# Patient Record
Sex: Female | Born: 1995 | Race: White | Hispanic: No | Marital: Single | Smoking: Current every day smoker
Health system: Southern US, Community
[De-identification: ages and names within clinical notes are randomized; demographics above are authoritative.]

## PROBLEM LIST (undated history)

## (undated) DIAGNOSIS — J45909 Unspecified asthma, uncomplicated: Secondary | ICD-10-CM

## (undated) HISTORY — PX: TONSILLECTOMY: SUR1361

---
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
## 2019-03-20 ENCOUNTER — Other Ambulatory Visit: Payer: Self-pay

## 2019-03-20 ENCOUNTER — Encounter (HOSPITAL_COMMUNITY): Payer: Self-pay

## 2019-03-20 ENCOUNTER — Emergency Department (HOSPITAL_COMMUNITY)
Admission: EM | Admit: 2019-03-20 | Discharge: 2019-03-21 | Disposition: A | Discharge status: Home / Self Care | Payer: 59 | Attending: Emergency Medicine | Admitting: Emergency Medicine

## 2019-03-20 ENCOUNTER — Emergency Department (HOSPITAL_COMMUNITY): Payer: 59

## 2019-03-20 DIAGNOSIS — R1031 Right lower quadrant pain: Secondary | ICD-10-CM | POA: Diagnosis not present

## 2019-03-20 DIAGNOSIS — R112 Nausea with vomiting, unspecified: Secondary | ICD-10-CM

## 2019-03-20 DIAGNOSIS — R7989 Other specified abnormal findings of blood chemistry: Secondary | ICD-10-CM | POA: Diagnosis not present

## 2019-03-20 DIAGNOSIS — J45909 Unspecified asthma, uncomplicated: Secondary | ICD-10-CM | POA: Insufficient documentation

## 2019-03-20 DIAGNOSIS — R197 Diarrhea, unspecified: Secondary | ICD-10-CM | POA: Diagnosis not present

## 2019-03-20 DIAGNOSIS — R1032 Left lower quadrant pain: Secondary | ICD-10-CM | POA: Diagnosis not present

## 2019-03-20 DIAGNOSIS — R109 Unspecified abdominal pain: Secondary | ICD-10-CM

## 2019-03-20 DIAGNOSIS — R1030 Lower abdominal pain, unspecified: Secondary | ICD-10-CM | POA: Diagnosis present

## 2019-03-20 DIAGNOSIS — F17210 Nicotine dependence, cigarettes, uncomplicated: Secondary | ICD-10-CM | POA: Diagnosis not present

## 2019-03-20 LAB — URINALYSIS, ROUTINE W REFLEX MICROSCOPIC
Bilirubin Urine: NEGATIVE
Glucose, UA: NEGATIVE mg/dL
Hgb urine dipstick: NEGATIVE
Ketones, ur: 20 mg/dL — AB
Nitrite: NEGATIVE
Protein, ur: NEGATIVE mg/dL
Specific Gravity, Urine: 1.008 (ref 1.005–1.030)
pH: 5 (ref 5.0–8.0)

## 2019-03-20 LAB — COMPREHENSIVE METABOLIC PANEL
ALT: 18 U/L (ref 0–44)
AST: 19 U/L (ref 15–41)
Albumin: 4.1 g/dL (ref 3.5–5.0)
Alkaline Phosphatase: 23 U/L — ABNORMAL LOW (ref 38–126)
Anion gap: 11 (ref 5–15)
BUN: 15 mg/dL (ref 6–20)
CO2: 22 mmol/L (ref 22–32)
Calcium: 8.3 mg/dL — ABNORMAL LOW (ref 8.9–10.3)
Chloride: 106 mmol/L (ref 98–111)
Creatinine, Ser: 1.41 mg/dL — ABNORMAL HIGH (ref 0.44–1.00)
GFR calc Af Amer: >60 mL/min (ref >60)
GFR calc non Af Amer: 53 mL/min — ABNORMAL LOW (ref >60)
Glucose, Bld: 99 mg/dL (ref 70–99)
Potassium: 3.4 mmol/L — ABNORMAL LOW (ref 3.5–5.1)
Sodium: 139 mmol/L (ref 135–145)
Total Bilirubin: 0.8 mg/dL (ref 0.3–1.2)
Total Protein: 6.5 g/dL (ref 6.5–8.1)

## 2019-03-20 LAB — CBC WITH DIFFERENTIAL/PLATELET
Abs Immature Granulocytes: 0.02 10*3/uL (ref 0.00–0.07)
Basophils Absolute: 0 10*3/uL (ref 0.0–0.1)
Basophils Relative: 0 %
Eosinophils Absolute: 0 10*3/uL (ref 0.0–0.5)
Eosinophils Relative: 0 %
HCT: 40.4 % (ref 36.0–46.0)
Hemoglobin: 13.4 g/dL (ref 12.0–15.0)
Immature Granulocytes: 0 %
Lymphocytes Relative: 9 %
Lymphs Abs: 1 10*3/uL (ref 0.7–4.0)
MCH: 32 pg (ref 26.0–34.0)
MCHC: 33.2 g/dL (ref 30.0–36.0)
MCV: 96.4 fL (ref 80.0–100.0)
Monocytes Absolute: 0.7 10*3/uL (ref 0.1–1.0)
Monocytes Relative: 6 %
Neutro Abs: 9.6 10*3/uL — ABNORMAL HIGH (ref 1.7–7.7)
Neutrophils Relative %: 85 %
Platelets: 303 10*3/uL (ref 150–400)
RBC: 4.19 MIL/uL (ref 3.87–5.11)
RDW: 11.9 % (ref 11.5–15.5)
WBC: 11.3 10*3/uL — ABNORMAL HIGH (ref 4.0–10.5)
nRBC: 0 % (ref 0.0–0.2)

## 2019-03-20 LAB — I-STAT BETA HCG BLOOD, ED (MC, WL, AP ONLY): I-stat hCG, quantitative: <5 m[IU]/mL (ref <5)

## 2019-03-20 LAB — LIPASE, BLOOD: Lipase: 20 U/L (ref 11–51)

## 2019-03-20 MED ORDER — IOHEXOL 300 MG/ML  SOLN
100.0000 mL | Freq: Once | INTRAMUSCULAR | Status: AC | PRN
  Administered 2019-03-20: 23:00:00 100 mL via INTRAVENOUS

## 2019-03-20 MED ORDER — SODIUM CHLORIDE 0.9 % IV BOLUS
1000.0000 mL | Freq: Once | INTRAVENOUS | Status: AC
  Administered 2019-03-20: 1000 mL via INTRAVENOUS

## 2019-03-20 MED ORDER — ONDANSETRON HCL 4 MG/2ML IJ SOLN
4.0000 mg | Freq: Once | INTRAMUSCULAR | Status: AC
  Administered 2019-03-20: 4 mg via INTRAVENOUS
  Filled 2019-03-20: qty 2

## 2019-03-20 MED ORDER — PROMETHAZINE HCL 25 MG/ML IJ SOLN
12.5000 mg | Freq: Once | INTRAMUSCULAR | Status: AC
  Administered 2019-03-20: 23:00:00 12.5 mg via INTRAVENOUS
  Filled 2019-03-20: qty 1

## 2019-03-20 MED ORDER — FENTANYL CITRATE (PF) 100 MCG/2ML IJ SOLN
25.0000 ug | Freq: Once | INTRAMUSCULAR | Status: AC
  Administered 2019-03-20: 22:00:00 25 ug via INTRAVENOUS
  Filled 2019-03-20: qty 2

## 2019-03-20 NOTE — ED Notes (Signed)
Pt transported to CT ?

## 2019-03-20 NOTE — ED Provider Notes (Signed)
[facility] EMERGENCY DEPARTMENT Provider Note   CSN: [id number] Arrival date & time: 03/20/19  2008     History   Chief Complaint Chief Complaint  Patient presents with   Abdominal Pain    HPI Madison Page is a 23 y.o. female.     HPI   Madison Page is a 23 y.o. female who presents to the Emergency Department complaining of worsening of lower abdominal pain that began 3 days ago.  She states the pain was sudden in onset and initially accompanied by diarrhea, nausea, and vomiting.  She states she no longer has diarrhea but continues to have multiple episodes of vomiting.  She was seen at an urgent care yesterday and given prescriptions for Cipro and Flagyl, but her symptoms are not improving.  She still reports having a normal menses with her last menstrual cycle beginning 4 days ago.  She has been tolerating fluids in very small amounts, but last meal was 2 days ago.  She denies fever, cough, dysuria, vaginal discharge and abnormal vaginal bleeding.  No previous abdominal surgeries.    Past Medical History:  Diagnosis Date   Asthma     There are no active problems to display for this patient.   Past Surgical History:  Procedure Laterality Date   TONSILLECTOMY       OB History   No obstetric history on file.      Home Medications    Prior to Admission medications   Not on File    Family History History reviewed. No pertinent family history.  Social History Social History   Tobacco Use   Smoking status: Current Every Day Smoker   Smokeless tobacco: Never Used  Substance Use Topics   Alcohol use: No   Drug use: Never     Allergies   Patient has no allergy information on record.   Review of Systems Review of Systems  Constitutional: Positive for appetite change. Negative for chills and fever.  HENT: Negative for sore throat.   Respiratory: Negative for cough, chest tightness and shortness of breath.   Cardiovascular: Negative for  chest pain.  Gastrointestinal: Positive for abdominal pain, diarrhea, nausea and vomiting. Negative for blood in stool.  Genitourinary: Negative for decreased urine volume, difficulty urinating, dysuria, flank pain, menstrual problem and vaginal discharge.  Musculoskeletal: Negative for back pain.  Skin: Negative for color change and rash.  Neurological: Negative for dizziness, weakness and numbness.  Hematological: Negative for adenopathy.     Physical Exam Updated Vital Signs BP (!) 111/59 (BP Location: Right Arm)    Pulse 60    Temp 98 F (36.7 C) (Oral)    Resp 17    Ht 5\' 9"  (1.753 m)    Wt 63.5 kg    LMP 03/16/2019    SpO2 100%    BMI 20.67 kg/m   Physical Exam Vitals signs and nursing note reviewed.  Constitutional:      General: She is not in acute distress.    Appearance: She is well-developed.     Comments: Patient is uncomfortable appearing but nontoxic  HENT:     Mouth/Throat:     Mouth: Mucous membranes are moist.  Cardiovascular:     Rate and Rhythm: Normal rate and regular rhythm.     Pulses: Normal pulses.  Pulmonary:     Effort: Pulmonary effort is normal.  Abdominal:     General: Bowel sounds are normal. There is no distension.     Palpations: Abdomen is  soft. There is no mass.     Tenderness: There is abdominal tenderness in the right lower quadrant and left lower quadrant. There is no right CVA tenderness or left CVA tenderness.     Comments: Diffuse tenderness of the lower abdomen with worsening pain of the right lower quadrant.  No guarding or rebound tenderness.   Musculoskeletal: Normal range of motion.  Skin:    General: Skin is warm.  Neurological:     General: No focal deficit present.     Mental Status: She is alert.      ED Treatments / Results  Labs (all labs ordered are listed, but only abnormal results are displayed) Labs Reviewed  CBC WITH DIFFERENTIAL/PLATELET - Abnormal; Notable for the following components:      Result Value   WBC  11.3 (*)    Neutro Abs 9.6 (*)    All other components within normal limits  URINALYSIS, ROUTINE W REFLEX MICROSCOPIC - Abnormal; Notable for the following components:   Color, Urine STRAW (*)    Ketones, ur 20 (*)    Leukocytes,Ua TRACE (*)    Bacteria, UA RARE (*)    All other components within normal limits  COMPREHENSIVE METABOLIC PANEL - Abnormal; Notable for the following components:   Potassium 3.4 (*)    Creatinine, Ser 1.41 (*)    Calcium 8.3 (*)    Alkaline Phosphatase 23 (*)    GFR calc non Af Amer 53 (*)    All other components within normal limits  LIPASE, BLOOD  I-STAT BETA HCG BLOOD, ED ([facility], [facility], [facility] ONLY)    EKG None  Radiology Ct Abdomen Pelvis W Contrast  Result Date: 03/20/2019 CLINICAL DATA:  Nausea vomiting abdominal pain EXAM: CT ABDOMEN AND PELVIS WITH CONTRAST TECHNIQUE: Multidetector CT imaging of the abdomen and pelvis was performed using the standard protocol following bolus administration of intravenous contrast. CONTRAST:  100mL OMNIPAQUE IOHEXOL 300 MG/ML  SOLN COMPARISON:  None. FINDINGS: Lower chest: Lung bases demonstrate pectus deformity of the lower anterior chest wall. The heart size is nonenlarged. The lung bases are clear Hepatobiliary: Heterogeneous liver enhancement. Hypodensity around the portal vessels, suspect for periportal edema. No calcified gallstone or biliary dilatation. Pancreas: Unremarkable. No pancreatic ductal dilatation or surrounding inflammatory changes. Spleen: Normal in size without focal abnormality. Adrenals/Urinary Tract: Adrenal glands are unremarkable. Kidneys are normal, without renal calculi, focal lesion, or hydronephrosis. Bladder is unremarkable. Stomach/Bowel: Slight wall thickening of the gastric antrum. Appendix appears normal. No evidence of bowel wall thickening, distention, or inflammatory changes. Liquid stools in the right colon. Vascular/Lymphatic: No significant vascular findings are present. No enlarged abdominal  or pelvic lymph nodes. Reproductive: Uterus and bilateral adnexa are unremarkable. Other: Trace free fluid in the pelvis.  No free air. Musculoskeletal: No acute or significant osseous findings. IMPRESSION: 1. Negative for appendicitis or bowel obstruction 2. Slight wall thickening of the gastric antrum is questionable for gastritis 3. Liver is slightly heterogenous in appearance and there is suggestion of mild periportal edema, which is nonspecific, though could correlate with LFTs to exclude hepatitis. 4. Trace free fluid in the pelvis Electronically Signed   By: Jasmine PangKim  Fujinaga M.D.   On: 03/20/2019 22:48    Procedures Procedures (including critical care time)  Medications Ordered in ED Medications  sodium chloride 0.9 % bolus 1,000 mL (0 mLs Intravenous Stopped 03/20/19 2327)  ondansetron (ZOFRAN) injection 4 mg (4 mg Intravenous Given 03/20/19 2142)  fentaNYL (SUBLIMAZE) injection 25 mcg (25 mcg Intravenous  Given 03/20/19 2142)  iohexol (OMNIPAQUE) 300 MG/ML solution 100 mL (100 mLs Intravenous Contrast Given 03/20/19 2234)  promethazine (PHENERGAN) injection 12.5 mg (12.5 mg Intravenous Given 03/20/19 2259)  sodium chloride 0.9 % bolus 1,000 mL (1,000 mLs Intravenous New Bag/Given 03/20/19 2359)     Initial Impression / Assessment and Plan / ED Course  I have reviewed the triage vital signs and the nursing notes.  Pertinent labs & imaging results that were available during my care of the patient were reviewed by me and considered in my medical decision making (see chart for details).    Initial blood collection for lipase and CMP hemolyzed, so blood was redrawn.   CT this evening is negative for acute appendicitis.  Possible gastritis.  Lab work reassuring, but patient's creatinine is elevated she also has ketones in her urine this is felt to be from dehydration secondary to vomiting and diarrhea.  She was given IV fluids here and antiemetic.  She is feeling better.  She has ambulated to the  restroom without difficulty.  She is tolerating oral fluids.  I feel that she is appropriate for discharge home.  She was prescribed Cipro and Flagyl recently, I have advised her to discontinue these.  I have also advised her of her elevated creatinine and that she will need to have this rechecked in 1 week.  She agrees to plan.  She appears appropriate for discharge home, and strict return precautions were also discussed.   Final Clinical Impressions(s) / ED Diagnoses   Final diagnoses:  Abdominal pain, unspecified abdominal location  Nausea vomiting and diarrhea  Elevated serum creatinine    ED Discharge Orders    None       Kem Parkinson, PA-C 03/21/19 0053    Daleen Bo, MD 03/21/19 1004

## 2019-03-20 NOTE — ED Triage Notes (Signed)
Pt c/o abdominal pain that started on Thursday. Went to urgent care and received abx. siad she wasn't getting any better so she came here. Nausea and vomiting. No diarrhea since thursday.  Also has been having poor appetite.

## 2019-03-21 MED ORDER — PROMETHAZINE HCL 25 MG PO TABS: 25.0000 mg | ORAL_TABLET | Freq: Four times a day (QID) | ORAL | 0 refills | Status: DC | PRN

## 2019-03-21 MED ORDER — METOCLOPRAMIDE HCL 10 MG PO TABS
10.0000 mg | ORAL_TABLET | Freq: Once | ORAL | Status: AC
  Administered 2019-03-21: 10 mg via ORAL
  Filled 2019-03-21: qty 1

## 2019-03-21 NOTE — ED Notes (Signed)
PA at bedside.

## 2019-03-21 NOTE — ED Notes (Signed)
Pt ambulatory to restroom with no assistance needed at this time. 

## 2019-03-21 NOTE — Discharge Instructions (Signed)
Your CT this evening did not show evidence of appendicitis.  Stop taking the Cipro and Flagyl.  Drink frequent small sips of clear fluids and Gatorade tonight and tomorrow.  You may start bland diet as tolerated tomorrow.  Your creatinine this evening was elevated.  This will need to be rechecked in 5 to 7 days.  You may contact the clinic listed to establish primary care or a local urgent care facility may be able to check this for you.  Return to the ER for any worsening symptoms such as fever, persistent vomiting or worsening abdominal pain.

## 2019-03-21 NOTE — ED Provider Notes (Signed)
Patient discharged by previous shift.  Presented with lower abdominal pain for the past 3 days with nausea, vomiting and diarrhea.  She had a work-up that included CT scan that was negative for appendicitis but showed possible gastritis.  She was still having pain and nausea, and I was asked to see patient by nursing staff prior to discharge due to ongoing symptoms.  Before I evaluated the patient she decided that she felt better and she wanted to leave and go home. I spoke to the patient in the hallway and she said that she did not want to be seen and just wanted to try to go home. She agreed to return if she felt worse.   I did not examine patient.    Ezequiel Essex, MD 03/21/19 [phone]

## 2019-03-24 NOTE — Progress Notes (Signed)
Referring Provider: Leisa Lenz, MD Primary Care Physician:  Patient, [name] Primary Gastroenterologist:  Dr. Gala Romney  Chief Complaint  Patient presents with  . Abdominal Pain    improved  . Nausea  . Blood In Stools    1 week ago.    HPI:   Madison Page is a 23 y.o. female presenting today at the request of Leisa Lenz, MD for abdominal pain, vomiting, heme positive stools.   Patient was seen in the ED on 03/20/19 for acute onset of lower abdominal pain, diarrhea, nausea, and vomiting x3 days.  She had been seen by urgent care on 03/19/2019 and was prescribed Cipro and Flagyl.  Her diarrhea resolved but she continued with abdominal pain and multiple episodes of vomiting.  Labs in the ED with slightly elevated WBC at 11.3 neutrophils slightly elevated at 9.6, otherwise normal.  Lipase was within normal limits.  CMP was remarkable for elevated creatinine at 1.41, slightly low potassium 3.4, slightly low calcium 8.3.  Urinalysis with ketones.  Pregnancy test negative.  CT abdomen and pelvis with contrast negative for appendicitis or bowel obstruction.  Slight wall thickening of the gastric antrum questionable for gastritis.  No bowel wall thickening.  Liver with slightly heterogenous in appearance with suggestion of mild periportal edema, which is nonspecific..  Trace free fluid in the pelvis.  Patient received fluids, antiemetics, Reglan, pain medication and began to feel improved.  She was discharged with Phenergan.  Today she states early Friday morning she developed severe diarrhea, lower abdominal pain, nausea with vomiting. Went to ED on Sunday because abdominal pain and vomiting was unbearable. Her diarrhea resolved on Friday after several bowel movements.  Reports she had 1 BM that had the color of a "red bell pepper" in part of the stool.  Denies eating anything red the day before.  Had popcorn and a fiber 1 bar. No bowel movement since Friday.  She is passing gas.   Cramping/sharp abdominal pain along with nausea/vomiting would come and go in waves.  Abdominal pain has been slowly improving and today is the first day without pain.  No identified triggers. Has been using Phenergan for nausea which helps some but ran out of oral Phenergan on Wednesday.  Has had persistent nausea for the last 12 hours.  Tried Phenergan suppository last night but felt symptoms worsened thereafter.  Has not vomited since Thursday.  Denies hematemesis or coffee-ground emesis. She did have headache/migraine last night that worsened nausea. Mints help nausea. No upper abdominal pain.   Prior to this acute episode, BMs usually soft and formed, daily to every couple of days. No rectal pain. No hemorrhoids.   No GERD symptoms. No dysphagia. No urinary symptoms. No fever. Admitted to chills. Some dizziness with pain and nausea. No passing out. Has been going to the [place]. Has intentionally lost about 15 lbs since May 2020. Thinks she has lost 5 lbs over the last week due to this acute illness.   No sick contacts. Had not eaten out recently.  Had not eaten anything out of the norm. No antibiotics or hospitalizations.   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   Ibuprofen a couple days a month. Usually first and second day of period. Will take 400 mg 4 times a day during this time.   Past Medical History:  Diagnosis Date  . Asthma     Past Surgical History:  Procedure Laterality Date  . TONSILLECTOMY  Current Outpatient Medications  Medication Sig Dispense Refill  . norgestimate-ethinyl estradiol (MONO-LINYAH) 0.25-35 MG-MCG tablet Take 1 tablet by mouth daily.    . promethazine (PHENERGAN) 25 MG suppository Place 25 mg rectally every 6 (six) hours as needed for nausea or vomiting.    . ondansetron (ZOFRAN) 4 MG tablet Take 1 tablet (4 mg total) by mouth every 8 (eight) hours as needed for nausea or vomiting. 30 tablet 1  . pantoprazole (PROTONIX) 40 MG tablet Take 1  tablet (40 mg total) by mouth daily. 30 tablet 3   No current facility-administered medications for this visit.     Allergies as of 03/25/2019  . (No Known Allergies)    Family History  Problem Relation Age of Onset  . Colon cancer Neg Hx   . Inflammatory bowel disease Neg Hx     Social History   Socioeconomic History  . Marital status: Single    Spouse name: Not on file  . Number of children: Not on file  . Years of education: Not on file  . Highest education level: Not on file  Occupational History  . Not on file  Social Needs  . Financial resource strain: Not on file  . Food insecurity    Worry: Not on file    Inability: Not on file  . Transportation needs    Medical: Not on file    Non-medical: Not on file  Tobacco Use  . Smoking status: Current Every Day Smoker  . Smokeless tobacco: Never Used  . Tobacco comment: vapes  Substance and Sexual Activity  . Alcohol use: No  . Drug use: Yes    Types: Marijuana    Comment: 3-4 times a month  . Sexual activity: Yes    Birth control/protection: Pill  Lifestyle  . Physical activity    Days per week: Not on file    Minutes per session: Not on file  . Stress: Not on file  Relationships  . Social Musician on phone: Not on file    Gets together: Not on file    Attends religious service: Not on file    Active member of club or organization: Not on file    Attends meetings of clubs or organizations: Not on file    Relationship status: Not on file  . Intimate partner violence    Fear of current or ex partner: Not on file    Emotionally abused: Not on file    Physically abused: Not on file    Forced sexual activity: Not on file  Other Topics Concern  . Not on file  Social History Narrative  . Not on file    Review of Systems: Gen: See HPI CV: Denies chest pain, heart palpitations  Resp: Denies shortness of breath or cough  GI: See HPI GU : Denies urinary burning, urinary frequency, urinary  hesitancy MS: Denies joint pain, muscle weakness Derm: Denies rash Psych: Denies depression, anxiety Heme: See HPI.  Physical Exam: BP 118/76   Pulse 76   Temp (!) 97 F (36.1 C) (Oral)   Ht 5\' 9"  (1.753 m)   Wt 141 lb (64 kg)   LMP 03/16/2019   BMI 20.82 kg/m  General:   Alert and oriented. Pleasant and cooperative. Well-nourished and well-developed.  Head:  Normocephalic and atraumatic. Eyes:  Without icterus, sclera clear and conjunctiva pink.  Ears:  Normal auditory acuity. Nose:  No deformity, discharge,  or lesions. Lungs:  Clear to auscultation  bilaterally. No wheezes, rales, or rhonchi. No distress.  Heart:  S1, S2 present without murmurs appreciated.  Abdomen:  +BS, soft, and non-distended.  Mild tenderness to palpation in the epigastric area and lower abdomen. No HSM noted. No guarding or rebound. No masses appreciated.  Rectal:  Deferred  Msk:  Symmetrical without gross deformities. Normal posture. Extremities:  Without edema. Neurologic:  Alert and  oriented x4;  grossly normal neurologically. Skin:  Intact without significant lesions or rashes. Psych: Normal mood and affect.

## 2019-03-25 ENCOUNTER — Ambulatory Visit (INDEPENDENT_AMBULATORY_CARE_PROVIDER_SITE_OTHER): Payer: 59 | Admitting: Gastroenterology

## 2019-03-25 ENCOUNTER — Encounter: Payer: Self-pay | Admitting: Gastroenterology

## 2019-03-25 ENCOUNTER — Encounter: Payer: Self-pay | Admitting: *Deleted

## 2019-03-25 ENCOUNTER — Ambulatory Visit: Payer: 59 | Admitting: Gastroenterology

## 2019-03-25 ENCOUNTER — Other Ambulatory Visit: Payer: Self-pay

## 2019-03-25 ENCOUNTER — Other Ambulatory Visit: Payer: Self-pay | Admitting: *Deleted

## 2019-03-25 ENCOUNTER — Ambulatory Visit (HOSPITAL_COMMUNITY)
Admission: RE | Admit: 2019-03-25 | Discharge: 2019-03-25 | Disposition: A | Discharge status: Home / Self Care | Payer: No Typology Code available for payment source | Source: Ambulatory Visit | Attending: Gastroenterology | Admitting: Gastroenterology

## 2019-03-25 VITALS — BP 118/76 | HR 76 | Temp 97.0°F | Ht 69.0 in | Wt 141.0 lb

## 2019-03-25 DIAGNOSIS — R1110 Vomiting, unspecified: Secondary | ICD-10-CM | POA: Insufficient documentation

## 2019-03-25 DIAGNOSIS — K3189 Other diseases of stomach and duodenum: Secondary | ICD-10-CM | POA: Diagnosis not present

## 2019-03-25 DIAGNOSIS — R1030 Lower abdominal pain, unspecified: Secondary | ICD-10-CM | POA: Insufficient documentation

## 2019-03-25 DIAGNOSIS — K5900 Constipation, unspecified: Secondary | ICD-10-CM | POA: Insufficient documentation

## 2019-03-25 DIAGNOSIS — R112 Nausea with vomiting, unspecified: Secondary | ICD-10-CM

## 2019-03-25 MED ORDER — ONDANSETRON HCL 4 MG PO TABS: 4.0000 mg | ORAL_TABLET | Freq: Three times a day (TID) | ORAL | 1 refills | Status: DC | PRN

## 2019-03-25 MED ORDER — PANTOPRAZOLE SODIUM 40 MG PO TBEC: 40.0000 mg | DELAYED_RELEASE_TABLET | Freq: Every day | ORAL | 3 refills | Status: DC

## 2019-03-25 NOTE — Assessment & Plan Note (Signed)
Addressed under lower abdominal pain.  

## 2019-03-25 NOTE — Assessment & Plan Note (Addendum)
Nausea and vomiting started on 03/18/2019 along with acute onset of lower abdominal pain and diarrhea.  Suspect patient likely had acute infectious gastroenteritis as symptoms seem to be improving.  However patient does continue with significant nausea.  CT abdomen and pelvis with contrast on 03/20/2019 with slight wall thickening of the gastric antrum questionable for gastritis.  Patient does admit to using 400 mg ibuprofen 4 times daily a couple times a month for menstrual cramps.  Denies hematemesis, coffee-ground emesis, GERD symptoms, upper abdominal pain, or melena.  Mild tenderness to palpation of the epigastric area on exam.  Although I suspect nausea and vomiting were likely related to acute illness, nausea remains significant and I feel patient needs further evaluation of the gastric wall thickening noted on CT.  Possible NSAID induced gastritis. ? H. Pylori. Less likely PUD.   Zofran 4 mg every 8 hours as needed. Protonix 40 mg daily 30 minutes before breakfast. EGD with propofol with Dr. Gala Romney in the near future. The risks, benefits, and alternatives have been discussed in detail with patient. They have stated understanding and desire to proceed. Follow-up after EGD. Patient advised to call if symptoms persist or worsen prior to her next visit.

## 2019-03-25 NOTE — Patient Instructions (Addendum)
Please have labs and abdominal x-ray completed.  We will call you with results.  Please start Protonix 40 mg daily 30 minutes before breakfast.  Avoid all NSAIDs including ibuprofen, Aleve, Advil, and Goody powders.  It is okay to use Tylenol as needed for pain.  Continue to follow bland diet.   I am sending in Zofran for you to use every 8 hours as needed for nausea.  We will get you scheduled for an upper endoscopy in the near future with Dr. Gala Romney.  Follow-up after upper endoscopy.  Please call if you have questions or concerns prior to your next visit or if symptoms worsen.  Aliene Altes, PA-C [facility]

## 2019-03-25 NOTE — Assessment & Plan Note (Addendum)
CT abdomen and pelvis on 03/20/2019 after patient presented to the ED for lower abdominal pain, diarrhea, nausea, and vomiting revealed slight wall thickening of the gastric antrum questionable for gastritis.  Diarrhea has resolved and abdominal pain slowly improved but patient continues with significant nausea.  Admits to 400 mg ibuprofen every 4 hours a couple times a month for menstrual cramps.  Denies hematemesis, coffee-ground emesis, GERD symptoms, subjective upper abdominal pain, or melena.   Although I suspect nausea and vomiting were likely related to acute illness, patient may also have NSAID induced gastritis. Although I doubt patient has other underlying organic cause of wall thickening noted on CT, I feel she needs direct visualization with EGD to verify this.   Protonix 40 mg daily. Avoid NSAIDs. Proceed with upper endoscopy in the near future with Dr. Gala Romney. The risks, benefits, and alternatives have been discussed in detail with patient. They have stated understanding and desire to proceed.

## 2019-03-25 NOTE — Assessment & Plan Note (Addendum)
23 year old female with past medical history of exercise-induced asthma presenting after acute onset of lower abdominal pain, diarrhea, nausea, and vomiting that started 7 days ago (03/18/2019). No sick contacts, had not eaten out or anything out of her normal, no undercooked meats.  Diarrhea resolved the day it started after several BMs.  No bowel movement since. Reported one episode of questionable blood in the stool.  Lower abdominal pain is improving with little to no pain today. She continues with waves of nausea. Has not vomited since yesterday, but now with persistent nausea for the last 12 hours.  Denies upper abdominal pain, GERD symptoms, dysphasia, hematemesis, coffee-ground emesis, or melena.  She has intentionally been losing weight but has lost a few additional pounds over the last week due to this acute illness.  CT abdomen and pelvis on 03/20/2019 with slight wall thickening of the gastric antrum questionable for gastritis.  No bowel wall thickening.  Liver slightly heterogenous with suggestion of mild periportal edema which is nonspecific.  LFTs were normal.  CBC with WBC 11.3 otherwise normal.  Lipase normal.  Creatinine elevated 1.41, potassium slightly low at 3.4.  Admits to ibuprofen a couple days a month for menstrual cramping.  Takes 400 mg every 4 hours on these days.  Exam with mild tenderness to palpation in the epigastric area and lower abdomen.  Patient may have had an acute infectious gastroenteritis that is resolving. No clear etiology. Encouraging that symptoms seem to be improving other than the nausea. Query whether nausea may be related to underlying gastritis with history of NSAID use. Doubt IBD as diarrhea was acute onset and resolved within 24 hours and CT with normal bowel. Single episode of ? Blood in the stool non-specific. Will need to continue to monitor this. May have obstipation due to significant diarrhea on Friday with decreased oral intake. Doubt obstruction.   Start  Protonix 40 mg daily.  Zofran 4 mg prn q8 hours  Abdominal x-ray to evaluate obstipation. Will likely start MiraLAX if no acute findings. CBC and BMP to follow WBC trend, electrolytes, and kidney function.  Avoid NSAIDs EGD with propofol with Dr. Gala Romney for evaluation of gastric wall thickening noted on CT. Risks, benefits, and alternatives have been discussed in detail with patient. They have stated understanding and desire to proceed.  Follow-up after EGD. Patient advised to call if Protonix and Zofran were not helping or if she had questions or concerns prior to her next visit. Also advised to call if she has return of brbpr.

## 2019-03-27 NOTE — Progress Notes (Signed)
Reviewed with Dr. Dario Guardian. No evidence of obstruction. There is stool throughout the colon. There are radiopacities within the bowel lumen that are likely something she ate. He could not commend on this specifically.   Madison Page, can you let patient know results? I tried calling her Friday and could not get an answer. Please ask if she has had a BM since I saw her on Friday.

## 2019-03-27 NOTE — H&P (View-Only) (Signed)
Reviewed with Dr. Litton. No evidence of obstruction. There is stool throughout the colon. There are radiopacities within the bowel lumen that are likely something she ate. He could not commend on this specifically.   Alicia, can you let patient know results? I tried calling her Friday and could not get an answer. Please ask if she has had a BM since I saw her on Friday.

## 2019-03-28 ENCOUNTER — Encounter: Payer: Self-pay | Admitting: *Deleted

## 2019-03-29 ENCOUNTER — Encounter (HOSPITAL_COMMUNITY): Payer: Self-pay

## 2019-03-29 ENCOUNTER — Telehealth: Payer: Self-pay | Admitting: *Deleted

## 2019-03-29 ENCOUNTER — Telehealth: Payer: Self-pay

## 2019-03-29 ENCOUNTER — Other Ambulatory Visit (HOSPITAL_COMMUNITY)
Admission: RE | Admit: 2019-03-29 | Discharge: 2019-03-29 | Disposition: A | Discharge status: Home / Self Care | Payer: No Typology Code available for payment source | Source: Ambulatory Visit | Attending: Internal Medicine | Admitting: Internal Medicine

## 2019-03-29 ENCOUNTER — Encounter (HOSPITAL_COMMUNITY)
Admission: RE | Admit: 2019-03-29 | Discharge: 2019-03-29 | Disposition: A | Discharge status: Home / Self Care | Payer: No Typology Code available for payment source | Source: Ambulatory Visit | Attending: Internal Medicine | Admitting: Internal Medicine

## 2019-03-29 ENCOUNTER — Other Ambulatory Visit: Payer: Self-pay

## 2019-03-29 DIAGNOSIS — Z20828 Contact with and (suspected) exposure to other viral communicable diseases: Secondary | ICD-10-CM | POA: Diagnosis not present

## 2019-03-29 DIAGNOSIS — Z01812 Encounter for preprocedural laboratory examination: Secondary | ICD-10-CM | POA: Insufficient documentation

## 2019-03-29 LAB — SARS CORONAVIRUS 2 (TAT 6-24 HRS): SARS Coronavirus 2: NEGATIVE

## 2019-03-29 LAB — PREGNANCY, URINE: Preg Test, Ur: NEGATIVE

## 2019-03-29 NOTE — Telephone Encounter (Signed)
Called and rescheduled pt.

## 2019-03-29 NOTE — Patient Instructions (Signed)
Madison Page  03/29/2019     @PREFPERIOPPHARMACY @   Your procedure is scheduled on  03/31/2019   Report to 04/02/2019 at  [facility]  A.M.  Call this number if you have problems the morning of surgery:  [phone]   Remember:  Do not eat or drink after midnight.                        Take these medicines the morning of surgery with A SIP OF WATER  zofran or phenergan and protonix.    Do not wear jewelry, make-up or nail polish.  Do not wear lotions, powders, or perfumes. Please wear deodorant and brush your teeth.  Do not shave 48 hours prior to surgery.  Men may shave face and neck.  Do not bring valuables to the hospital.  [facility] is not responsible for any belongings or valuables.  Contacts, dentures or bridgework may not be worn into surgery.  Leave your suitcase in the car.  After surgery it may be brought to your room.  For patients admitted to the hospital, discharge time will be determined by your treatment team.  Patients discharged the day of surgery will not be allowed to drive home.   Name and phone number of your driver:   family Special instructions:  None  Please read over the following fact sheets that you were given. Anesthesia Post-op Instructions and Care and Recovery After Surgery       Upper Endoscopy, Adult, Care After This sheet gives you information about how to care for yourself after your procedure. Your health care provider may also give you more specific instructions. If you have problems or questions, contact your health care provider. What can I expect after the procedure? After the procedure, it is common to have:  A sore throat.  Mild stomach pain or discomfort.  Bloating.  Nausea. Follow these instructions at home:   Follow instructions from your health care provider about what to eat or drink after your procedure.  Return to your normal activities as told by your health care provider. Ask your health care  provider what activities are safe for you.  Take over-the-counter and prescription medicines only as told by your health care provider.  Do not drive for 24 hours if you were given a sedative during your procedure.  Keep all follow-up visits as told by your health care provider. This is important. Contact a health care provider if you have:  A sore throat that lasts longer than one day.  Trouble swallowing. Get help right away if:  You vomit blood or your vomit looks like coffee grounds.  You have: ? A fever. ? Bloody, black, or tarry stools. ? A severe sore throat or you cannot swallow. ? Difficulty breathing. ? Severe pain in your chest or abdomen. Summary  After the procedure, it is common to have a sore throat, mild stomach discomfort, bloating, and nausea.  Do not drive for 24 hours if you were given a sedative during the procedure.  Follow instructions from your health care provider about what to eat or drink after your procedure.  Return to your normal activities as told by your health care provider. This information is not intended to replace advice given to you by your health care provider. Make sure you discuss any questions you have with your health care provider. Document Released: 12/02/2011 Document Revised: 11/24/2017 Document Reviewed: 11/02/2017 Elsevier Patient  Education  [place] After These instructions provide you with information about caring for yourself after your procedure. Your health care provider may also give you more specific instructions. Your treatment has been planned according to current medical practices, but problems sometimes occur. Call your health care provider if you have any problems or questions after your procedure. What can I expect after the procedure? After your procedure, you may:  Feel sleepy for several hours.  Feel clumsy and have poor balance for several hours.  Feel forgetful  about what happened after the procedure.  Have poor judgment for several hours.  Feel nauseous or vomit.  Have a sore throat if you had a breathing tube during the procedure. Follow these instructions at home: For at least 24 hours after the procedure:      Have a responsible adult stay with you. It is important to have someone help care for you until you are awake and alert.  Rest as needed.  Do not: ? Participate in activities in which you could fall or become injured. ? Drive. ? Use heavy machinery. ? Drink alcohol. ? Take sleeping pills or medicines that cause drowsiness. ? Make important decisions or sign legal documents. ? Take care of children on your own. Eating and drinking  Follow the diet that is recommended by your health care provider.  If you vomit, drink water, juice, or soup when you can drink without vomiting.  Make sure you have little or no nausea before eating solid foods. General instructions  Take over-the-counter and prescription medicines only as told by your health care provider.  If you have sleep apnea, surgery and certain medicines can increase your risk for breathing problems. Follow instructions from your health care provider about wearing your sleep device: ? Anytime you are sleeping, including during daytime naps. ? While taking prescription pain medicines, sleeping medicines, or medicines that make you drowsy.  If you smoke, do not smoke without supervision.  Keep all follow-up visits as told by your health care provider. This is important. Contact a health care provider if:  You keep feeling nauseous or you keep vomiting.  You feel light-headed.  You develop a rash.  You have a fever. Get help right away if:  You have trouble breathing. Summary  For several hours after your procedure, you may feel sleepy and have poor judgment.  Have a responsible adult stay with you for at least 24 hours or until you are awake and alert.  This information is not intended to replace advice given to you by your health care provider. Make sure you discuss any questions you have with your health care provider. Document Released: 09/23/2015 Document Revised: 08/31/2017 Document Reviewed: 09/23/2015 Elsevier Patient Education  2020 [employer].

## 2019-03-29 NOTE — Telephone Encounter (Signed)
Copied from [place] [id number]. Topic: Appointment Scheduling - Scheduling Inquiry for Clinic >> Mar 29, 2019 12:32 PM Erick Blinks wrote: Reason for CRM: Pt called requesting to reschedule new pt appt. Unable to access appt desk, currently [facility] scheduler is using her chart and blocking my access. Please advise

## 2019-03-29 NOTE — Telephone Encounter (Signed)
Patient on cancellation list. Called patient RMR had cancellation 10/15 at 9:00am. Patient agreeable to appt. Discussed EGD instructions with pt in detail. She is aware pre-op nurse will call today for pre-op phone call. She is scheduled for COVID-19 testing today but will call Maudie Mercury as she needs to work today. Nothing further needed

## 2019-03-31 ENCOUNTER — Ambulatory Visit (HOSPITAL_COMMUNITY): Payer: No Typology Code available for payment source | Admitting: Anesthesiology

## 2019-03-31 ENCOUNTER — Ambulatory Visit (HOSPITAL_COMMUNITY)
Admission: RE | Admit: 2019-03-31 | Discharge: 2019-03-31 | Disposition: A | Discharge status: Home / Self Care | Payer: No Typology Code available for payment source | Attending: Internal Medicine | Admitting: Internal Medicine

## 2019-03-31 ENCOUNTER — Encounter (HOSPITAL_COMMUNITY)
Admission: RE | Disposition: A | Discharge status: Home / Self Care | Payer: Self-pay | Source: Home / Self Care | Attending: Internal Medicine

## 2019-03-31 ENCOUNTER — Ambulatory Visit: Payer: 59 | Admitting: Family Medicine

## 2019-03-31 ENCOUNTER — Encounter (HOSPITAL_COMMUNITY): Payer: Self-pay | Admitting: Anesthesiology

## 2019-03-31 DIAGNOSIS — R933 Abnormal findings on diagnostic imaging of other parts of digestive tract: Secondary | ICD-10-CM | POA: Insufficient documentation

## 2019-03-31 DIAGNOSIS — K319 Disease of stomach and duodenum, unspecified: Secondary | ICD-10-CM | POA: Diagnosis not present

## 2019-03-31 DIAGNOSIS — K921 Melena: Secondary | ICD-10-CM | POA: Insufficient documentation

## 2019-03-31 DIAGNOSIS — R112 Nausea with vomiting, unspecified: Secondary | ICD-10-CM | POA: Diagnosis not present

## 2019-03-31 DIAGNOSIS — K3189 Other diseases of stomach and duodenum: Secondary | ICD-10-CM

## 2019-03-31 DIAGNOSIS — Z793 Long term (current) use of hormonal contraceptives: Secondary | ICD-10-CM | POA: Diagnosis not present

## 2019-03-31 DIAGNOSIS — K219 Gastro-esophageal reflux disease without esophagitis: Secondary | ICD-10-CM | POA: Diagnosis not present

## 2019-03-31 DIAGNOSIS — Z79899 Other long term (current) drug therapy: Secondary | ICD-10-CM | POA: Diagnosis not present

## 2019-03-31 DIAGNOSIS — K269 Duodenal ulcer, unspecified as acute or chronic, without hemorrhage or perforation: Secondary | ICD-10-CM | POA: Diagnosis not present

## 2019-03-31 HISTORY — PX: BIOPSY: SHX5522

## 2019-03-31 HISTORY — PX: ESOPHAGOGASTRODUODENOSCOPY (EGD) WITH PROPOFOL: SHX5813

## 2019-03-31 SURGERY — ESOPHAGOGASTRODUODENOSCOPY (EGD) WITH PROPOFOL
Anesthesia: General

## 2019-03-31 MED ORDER — KETAMINE HCL 50 MG/5ML IJ SOSY
PREFILLED_SYRINGE | INTRAMUSCULAR | Status: AC
  Filled 2019-03-31: qty 5

## 2019-03-31 MED ORDER — LIDOCAINE HCL (CARDIAC) PF 100 MG/5ML IV SOSY
PREFILLED_SYRINGE | INTRAVENOUS | Status: DC | PRN
  Administered 2019-03-31: 40 mg via INTRAVENOUS

## 2019-03-31 MED ORDER — KETAMINE HCL 10 MG/ML IJ SOLN
INTRAMUSCULAR | Status: DC | PRN
  Administered 2019-03-31 (×2): 10 mg via INTRAVENOUS

## 2019-03-31 MED ORDER — LACTATED RINGERS IV SOLN
INTRAVENOUS | Status: DC | PRN
  Administered 2019-03-31: 08:00:00 via INTRAVENOUS

## 2019-03-31 MED ORDER — PROPOFOL 10 MG/ML IV BOLUS
INTRAVENOUS | Status: DC | PRN
  Administered 2019-03-31: 20 mg via INTRAVENOUS
  Administered 2019-03-31: 10 mg via INTRAVENOUS

## 2019-03-31 MED ORDER — CHLORHEXIDINE GLUCONATE CLOTH 2 % EX PADS: 6.0000 | MEDICATED_PAD | Freq: Once | CUTANEOUS | Status: DC

## 2019-03-31 MED ORDER — HYDROCODONE-ACETAMINOPHEN 7.5-325 MG PO TABS: 1.0000 | ORAL_TABLET | Freq: Once | ORAL | Status: DC | PRN

## 2019-03-31 MED ORDER — PROPOFOL 500 MG/50ML IV EMUL
INTRAVENOUS | Status: DC | PRN
  Administered 2019-03-31: 150 ug/kg/min via INTRAVENOUS

## 2019-03-31 MED ORDER — LACTATED RINGERS IV SOLN: INTRAVENOUS | Status: DC

## 2019-03-31 MED ORDER — MIDAZOLAM HCL 2 MG/2ML IJ SOLN: 0.5000 mg | Freq: Once | INTRAMUSCULAR | Status: DC | PRN

## 2019-03-31 MED ORDER — PROMETHAZINE HCL 25 MG/ML IJ SOLN: 6.2500 mg | INTRAMUSCULAR | Status: DC | PRN

## 2019-03-31 MED ORDER — STERILE WATER FOR IRRIGATION IR SOLN
Status: DC | PRN
  Administered 2019-03-31: 1.5 mL

## 2019-03-31 MED ORDER — HYDROMORPHONE HCL 1 MG/ML IJ SOLN: 0.2500 mg | INTRAMUSCULAR | Status: DC | PRN

## 2019-03-31 MED ORDER — GLYCOPYRROLATE 0.2 MG/ML IJ SOLN
INTRAMUSCULAR | Status: DC | PRN
  Administered 2019-03-31: 0.2 mg via INTRAVENOUS

## 2019-03-31 NOTE — Anesthesia Procedure Notes (Signed)
Procedure Name: General with mask airway Performed by: Adams, Amy A, CRNA Pre-anesthesia Checklist: Patient identified, Emergency Drugs available, Suction available, Timeout performed and Patient being monitored Patient Re-evaluated:Patient Re-evaluated prior to induction Oxygen Delivery Method: Non-rebreather mask       

## 2019-03-31 NOTE — Transfer of Care (Signed)
Immediate Anesthesia Transfer of Care Note  Patient: Madison Page  Procedure(s) Performed: ESOPHAGOGASTRODUODENOSCOPY (EGD) WITH PROPOFOL (N/A ) BIOPSY  Patient Location: PACU  Anesthesia Type:General  Level of Consciousness: awake, alert , oriented and patient cooperative  Airway & Oxygen Therapy: Patient Spontanous Breathing  Post-op Assessment: Report given to RN and Post -op Vital signs reviewed and stable  Post vital signs: Reviewed and stable  Last Vitals:  Vitals Value Taken Time  BP    Temp    Pulse 63 03/31/19 0840  Resp 17 03/31/19 0840  SpO2 100 % 03/31/19 0840  Vitals shown include unvalidated device data.  Last Pain:  Vitals:   03/31/19 0821  PainSc: 5          Complications: No apparent anesthesia complications

## 2019-03-31 NOTE — Discharge Instructions (Signed)
EGD Discharge instructions Please read the instructions outlined below and refer to this sheet in the next few weeks. These discharge instructions provide you with general information on caring for yourself after you leave the hospital. Your doctor may also give you specific instructions. While your treatment has been planned according to the most current medical practices available, unavoidable complications occasionally occur. If you have any problems or questions after discharge, please call your doctor. ACTIVITY  You may resume your regular activity but move at a slower pace for the next 24 hours.   Take frequent rest periods for the next 24 hours.   Walking will help expel (get rid of) the air and reduce the bloated feeling in your abdomen.   No driving for 24 hours (because of the anesthesia (medicine) used during the test).   You may shower.   Do not sign any important legal documents or operate any machinery for 24 hours (because of the anesthesia used during the test).  NUTRITION  Drink plenty of fluids.   You may resume your normal diet.   Begin with a light meal and progress to your normal diet.   Avoid alcoholic beverages for 24 hours or as instructed by your caregiver.  MEDICATIONS  You may resume your normal medications unless your caregiver tells you otherwise.  WHAT YOU CAN EXPECT TODAY  You may experience abdominal discomfort such as a feeling of fullness or gas pains.  FOLLOW-UP  Your doctor will discuss the results of your test with you.  SEEK IMMEDIATE MEDICAL ATTENTION IF ANY OF THE FOLLOWING OCCUR:  Excessive nausea (feeling sick to your stomach) and/or vomiting.   Severe abdominal pain and distention (swelling).   Trouble swallowing.   Temperature over 101 F (37.8 C).   Rectal bleeding or vomiting of blood.    Stomach was inflamed.  It was biopsied.  Please avoid ibuprofen type medications moving forward; Tylenol okay  Avoid marijuana as  this can actually worsen nausea and not help it.  Office visit with [place] in 3 months  Further recommendations to follow pending review of pathology report  I discussed my findings and recommendations with the patient's mother Onesty Clair at [phone]     Monitored Anesthesia Care, Care After These instructions provide you with information about caring for yourself after your procedure. Your health care provider may also give you more specific instructions. Your treatment has been planned according to current medical practices, but problems sometimes occur. Call your health care provider if you have any problems or questions after your procedure. What can I expect after the procedure? After your procedure, you may:  Feel sleepy for several hours.  Feel clumsy and have poor balance for several hours.  Feel forgetful about what happened after the procedure.  Have poor judgment for several hours.  Feel nauseous or vomit.  Have a sore throat if you had a breathing tube during the procedure. Follow these instructions at home: For at least 24 hours after the procedure:      Have a responsible adult stay with you. It is important to have someone help care for you until you are awake and alert.  Rest as needed.  Do not: ? Participate in activities in which you could fall or become injured. ? Drive. ? Use heavy machinery. ? Drink alcohol. ? Take sleeping pills or medicines that cause drowsiness. ? Make important decisions or sign legal documents. ? Take care of children on your own. Eating and drinking  Follow the diet that is recommended by your health care provider.  If you vomit, drink water, juice, or soup when you can drink without vomiting.  Make sure you have little or no nausea before eating solid foods. General instructions  Take over-the-counter and prescription medicines only as told by your health care provider.  If you have sleep apnea, surgery and  certain medicines can increase your risk for breathing problems. Follow instructions from your health care provider about wearing your sleep device: ? Anytime you are sleeping, including during daytime naps. ? While taking prescription pain medicines, sleeping medicines, or medicines that make you drowsy.  If you smoke, do not smoke without supervision.  Keep all follow-up visits as told by your health care provider. This is important. Contact a health care provider if:  You keep feeling nauseous or you keep vomiting.  You feel light-headed.  You develop a rash.  You have a fever. Get help right away if:  You have trouble breathing. Summary  For several hours after your procedure, you may feel sleepy and have poor judgment.  Have a responsible adult stay with you for at least 24 hours or until you are awake and alert. This information is not intended to replace advice given to you by your health care provider. Make sure you discuss any questions you have with your health care provider. Document Released: 09/23/2015 Document Revised: 08/31/2017 Document Reviewed: 09/23/2015 Elsevier Patient Education  2020 [employer].

## 2019-03-31 NOTE — Anesthesia Preprocedure Evaluation (Signed)
Anesthesia Evaluation  Patient identified by MRN, date of birth, ID band Patient awake    Reviewed: Allergy & Precautions, NPO status , Patient's Chart, lab work & pertinent test results  Airway Mallampati: I  TM Distance: >3 FB Neck ROM: Full    Dental no notable dental hx. (+) Teeth Intact   Pulmonary asthma , former smoker,  Denies recent inhaler use    Pulmonary exam normal breath sounds clear to auscultation       Cardiovascular Exercise Tolerance: Good negative cardio ROS Normal cardiovascular examI Rhythm:Regular Rate:Normal     Neuro/Psych negative neurological ROS  negative psych ROS   GI/Hepatic Neg liver ROS, GERD  Medicated and Controlled,Here for EGD for N/V Gastric wall thickening on Scan  States vomited once this am     Endo/Other  negative endocrine ROS  Renal/GU negative Renal ROS  negative genitourinary   Musculoskeletal negative musculoskeletal ROS (+)   Abdominal   Peds negative pediatric ROS (+)  Hematology negative hematology ROS (+)   Anesthesia Other Findings   Reproductive/Obstetrics negative OB ROS                             Anesthesia Physical Anesthesia Plan  ASA: II  Anesthesia Plan: General   Post-op Pain Management:    Induction: Intravenous  PONV Risk Score and Plan: 3 and TIVA, Propofol infusion, Midazolam, Ondansetron and Treatment may vary due to age or medical condition  Airway Management Planned: Nasal Cannula and Simple Face Mask  Additional Equipment:   Intra-op Plan:   Post-operative Plan:   Informed Consent: I have reviewed the patients History and Physical, chart, labs and discussed the procedure including the risks, benefits and alternatives for the proposed anesthesia with the patient or authorized representative who has indicated his/her understanding and acceptance.     Dental advisory given  Plan Discussed with:  CRNA  Anesthesia Plan Comments: (Plan Full PPE use  Plan GA with GETA as needed d/w pt -WTP with same after Q&A  )        Anesthesia Quick Evaluation

## 2019-03-31 NOTE — Anesthesia Postprocedure Evaluation (Signed)
Anesthesia Post Note  Patient: [name]  Procedure(s) Performed: ESOPHAGOGASTRODUODENOSCOPY (EGD) WITH PROPOFOL (N/A ) BIOPSY  Patient location during evaluation: PACU Anesthesia Type: General Level of consciousness: awake and alert, oriented and patient cooperative Pain management: pain level controlled Vital Signs Assessment: post-procedure vital signs reviewed and stable Respiratory status: spontaneous breathing Cardiovascular status: stable Postop Assessment: no apparent nausea or vomiting Anesthetic complications: no     Last Vitals:  Vitals:   03/31/19 0838  BP: 121/76  Pulse: 62  Resp: 17  Temp: (!) 36.4 C  SpO2: 100%    Last Pain:  Vitals:   03/31/19 0838  PainSc: 0-No pain                 Cherrill Scrima A

## 2019-03-31 NOTE — Op Note (Signed)
[facility] Patient Name: Madison PianCaitlan Page Procedure Date: 03/31/2019 8:13 AM MRN: [id number] Date of Birth: 19-Jan-1996 Attending MD: Gennette Pacobert Michael Rourk , MD CSN: [id number] Age: [age] Admit Type: Outpatient Procedure:                Upper GI endoscopy Indications:              Abnormal CT of the GI tract Providers:                Gennette Pacobert Michael Rourk, MD, Buel ReamAngela A. Thomasena Edisollins RN, RN,                            Edythe ClarityKelly Cox, Technician Referring MD:              Medicines:                Propofol per Anesthesia Complications:            No immediate complications. Estimated Blood Loss:     Estimated blood loss was minimal. Procedure:                Pre-Anesthesia Assessment:                           - Prior to the procedure, a History and Physical                            was performed, and patient medications and                            allergies were reviewed. The patient's tolerance of                            previous anesthesia was also reviewed. The risks                            and benefits of the procedure and the sedation                            options and risks were discussed with the patient.                            All questions were answered, and informed consent                            was obtained. Prior Anticoagulants: The patient has                            taken no previous anticoagulant or antiplatelet                            agents. ASA Grade Assessment: II - A patient with                            mild systemic disease. After reviewing the risks  and benefits, the patient was deemed in                            satisfactory condition to undergo the procedure.                           After obtaining informed consent, the endoscope was                            passed under direct vision. Throughout the                            procedure, the patient's blood pressure, pulse, and   oxygen saturations were monitored continuously. The                            GIF-H190 ([id number]) scope was introduced through the                            mouth, and advanced to the second part of duodenum.                            The upper GI endoscopy was accomplished without                            difficulty. The patient tolerated the procedure                            well. Scope In: 8:25:11 AM Scope Out: 8:30:59 AM Total Procedure Duration: 0 hours 5 minutes 48 seconds  Findings:      The examined esophagus was normal.      Multiple erosions were found in the gastric antrum. No ulcer or       infiltrating process. This was biopsied with a cold forceps for       histology. Estimated blood loss was minimal.      A few erosions were found in the duodenal bulb second portion. Impression:               - Normal esophagus.                           - Erosive gastropathy. Biopsied. I suspect NSAID                            effect                           - Duodenal erosions. Moderate Sedation:      Moderate (conscious) sedation was personally administered by an       anesthesia professional. The following parameters were monitored: oxygen       saturation, heart rate, blood pressure, respiratory rate, EKG, adequacy       of pulmonary ventilation, and response to care. Recommendation:           - Patient has a contact number available for  emergencies. The signs and symptoms of potential                            delayed complications were discussed with the                            patient. Return to normal activities tomorrow.                            Written discharge instructions were provided to the                            patient.                           - Resume previous diet.                           - Continue present medications. Tinea Protonix 40                            mg once daily. Avoid all aspirin/NSAID products.                             Commend cessation of marijuana use. May need GYN                            evaluation.                           - Await pathology results.                           -Office visit with [place] in 6 weeks Procedure Code(s):        --- Professional ---                           [phone], Esophagogastroduodenoscopy, flexible,                            transoral; with biopsy, single or multiple Diagnosis Code(s):        --- Professional ---                           K31.89, Other diseases of stomach and duodenum                           K26.9, Duodenal ulcer, unspecified as acute or                            chronic, without hemorrhage or perforation                           R93.3, Abnormal findings on diagnostic imaging of  other parts of digestive tract CPT copyright 2019 American Medical Association. All rights reserved. The codes documented in this report are preliminary and upon coder review may  be revised to meet current compliance requirements. Gerrit Friends. Rourk, MD Gennette Pac, MD 03/31/2019 8:45:03 AM This report has been signed electronically. Number of Addenda: 0

## 2019-03-31 NOTE — Interval H&P Note (Signed)
History and Physical Interval Note:  03/31/2019 8:09 AM  Madison Page  has presented today for surgery, with the diagnosis of n/v,  gastric wall thickening.  The various methods of treatment have been discussed with the patient and family. After consideration of risks, benefits and other options for treatment, the patient has consented to  Procedure(s) with comments: ESOPHAGOGASTRODUODENOSCOPY (EGD) WITH PROPOFOL (N/A) - 3:00pm as a surgical intervention.  The patient's history has been reviewed, patient examined, no change in status, stable for surgery.  I have reviewed the patient's chart and labs.  Questions were answered to the patient's satisfaction.     No change.  No dysphagia.  Diagnostic EGD per plan.  The risks, benefits, limitations, alternatives and imponderables have been reviewed with the patient. Potential for esophageal dilation, biopsy, etc. have also been reviewed.  Questions have been answered. All parties agreeable.  Manus Rudd

## 2019-04-01 LAB — SURGICAL PATHOLOGY

## 2019-04-03 ENCOUNTER — Encounter: Payer: Self-pay | Admitting: Internal Medicine

## 2019-04-05 ENCOUNTER — Ambulatory Visit: Payer: 59 | Admitting: Family Medicine

## 2019-04-06 ENCOUNTER — Ambulatory Visit: Payer: 59 | Admitting: Gastroenterology

## 2019-04-07 ENCOUNTER — Encounter (HOSPITAL_COMMUNITY): Payer: Self-pay | Admitting: Internal Medicine

## 2019-04-26 ENCOUNTER — Telehealth: Payer: Self-pay | Admitting: *Deleted

## 2019-04-26 NOTE — Telephone Encounter (Signed)
Pt says that her medication (Zofran) doesn't seem to be working recently.  Pt still having nausea.  She says she has been using mints to help.  Had EGD done on 03/31/2019.  Pt says that she is starting to have bad stomach cramps.  [phone]

## 2019-04-26 NOTE — Telephone Encounter (Signed)
Spoke with pt. Pt is still having some nausea. Nausea comes in waves and is controlled some with Zofran. Zofran isn't helping pt as much as it was. Pt has been having some lower abdominal cramping that feels as though they're menstrual cramps. When asked is her cycle coming on, pt said her cycle just went off. The cramping comes and goes throughout the day and feels better when she takes tylenol. The cramping in her lower abdomen is at a 6 with 10 being the highest.

## 2019-04-27 ENCOUNTER — Other Ambulatory Visit: Payer: Self-pay | Admitting: Gastroenterology

## 2019-04-27 NOTE — Telephone Encounter (Signed)
When does the nausea occur? After meals? Any correlation with fried/fatty/greasy foods? Is she still taking Protonix every day? Has she been using any NSAIDs? Any vomiting?   Is she having a daily BM? Hard stools, straining, or constipation?

## 2019-04-27 NOTE — Telephone Encounter (Signed)
Spoke with pt. The nausea comes an hour after eating and first thing in the morning. Pt tries to avoid fried, fatty/greasy food. Pt ate a stake, salad and baked potatoes with cheese. Pt had some nausea 1 hour after eating the steak. Pt is taking Pantoprazole daily as directed, avoiding NSAIDs and reports no vomiting. Pt is having a bowel movement once weekly. Pt states she is dealing with constipation and takes a capful of Miralax daily. Miralax seems to upset pt's stomach and cause abdominal pain per pt.

## 2019-04-27 NOTE — Telephone Encounter (Signed)
Noted. Pt wants to try samples first before sending RX to her pharmacy. Pt will pick Linzess 145 mcg up today.

## 2019-04-27 NOTE — Telephone Encounter (Signed)
Noted. Lets try optimizing her bowel regimen. If she would like, she can come by and pick up samples of Linzess 145 to try or I can go ahead and send in a prescription. Not sure how much this will cost. She will take Linzess 145 mcg daily on an empty stomach (>30 minutes before breakfast). She should stop MiraLAX when she starts Linzess. Linzess may cause diarrhea initially, but hopefully this will decrease as she continues taking it.

## 2019-05-04 ENCOUNTER — Other Ambulatory Visit: Payer: Self-pay | Admitting: Gastroenterology

## 2019-05-04 ENCOUNTER — Telehealth: Payer: Self-pay

## 2019-05-04 DIAGNOSIS — R112 Nausea with vomiting, unspecified: Secondary | ICD-10-CM

## 2019-05-04 DIAGNOSIS — R110 Nausea: Secondary | ICD-10-CM

## 2019-05-04 MED ORDER — ONDANSETRON HCL 4 MG PO TABS: 4.0000 mg | ORAL_TABLET | Freq: Three times a day (TID) | ORAL | 1 refills | Status: DC | PRN

## 2019-05-04 NOTE — Telephone Encounter (Signed)
New rx sent for #18 rather than #30.

## 2019-05-04 NOTE — Telephone Encounter (Signed)
Noted  

## 2019-05-04 NOTE — Telephone Encounter (Signed)
Tried to do a PA for the pt to get zofran #30. Marland Kitchen According to [facility], she has been denied because she does not have hyperemesis gravidarum or receiving radiation therapy or chemo therapy.  Per [employer], the insurance will pay for #18 per 21 days without a PA.   Cyril Mourning, do you want to send in new rx for #18 instead of #30?

## 2019-05-24 ENCOUNTER — Other Ambulatory Visit (HOSPITAL_COMMUNITY): Payer: 59

## 2019-07-04 ENCOUNTER — Ambulatory Visit: Payer: 59 | Admitting: Nurse Practitioner

## 2019-07-04 NOTE — Progress Notes (Deleted)
Referring Provider: No ref. provider found Primary Care Physician:  Patient, [name] Primary GI:  Dr. Jena Gauss  No chief complaint on file.   HPI:   Madison Page is a 24 y.o. female who presents for follow-up on nausea, vomiting, gastric wall thickening.  Patient was last seen in our office 03/25/2019 for lower abdominal pain, obstipation, non-intractable nausea and vomiting, gastric wall thickening.  Previous ED visit in October 2020 for lower abdominal pain, diarrhea, nausea, vomiting for 3 days.  Prescribe Cipro and Flagyl previously by urgent care.  Her diarrhea resolved but she continued with pain.  In the ED WBC elevated mildly at 11.3.  Creatinine mildly elevated at 1.41 otherwise essentially normal.  CT of the abdomen and pelvis negative for appendicitis or bowel obstruction with slight wall thickening of the gastric antrum questionable for gastritis.  Liver with slightly heterogenous appearance suggestive of mild periportal edema which is nonspecific.  She was discharged from the ED on Phenergan.  At her last visit noted a single episode of a bowel movement with the part that was "like red bell pepper".  Passing gas but minimal stools since ED visit.  Cramping and sharp abdominal pain along with nausea and vomiting intermittent.  Abdominal pain slowly improving the day of her visit and was the first day without pain.  Phenergan for nausea which helps but she ran out.  Persistent nausea for the last 12 hours.  Meds help with her nausea.  Prior to her acute episode bowel movements were usually soft and formed every day to every couple days.  No GERD symptoms.  No sick contacts, no new foods.  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  Ibuprofen a couple days a month on the order of 400 mg 4 times a day.  Recommended Zofran, Protonix 40 mg daily, EGD for further evaluation.  Follow-up post procedure.  EGD completed 03/31/2019 which found normal esophagus, erosive gastropathy status post  biopsy suspected NSAID effect, duodenal erosions.  Surgical pathology found the biopsies to be mild reactive gastropathy without metaplasia, dysplasia, or malignancy.  Recommended continue PPI and strict NSAID avoidance.  Today she states   Past Medical History:  Diagnosis Date  . Asthma     Past Surgical History:  Procedure Laterality Date  . BIOPSY  03/31/2019   Procedure: BIOPSY;  Surgeon: Corbin Ade, MD;  Location: [facility];  Service: Endoscopy;;  . ESOPHAGOGASTRODUODENOSCOPY (EGD) WITH PROPOFOL N/A 03/31/2019   Procedure: ESOPHAGOGASTRODUODENOSCOPY (EGD) WITH PROPOFOL;  Surgeon: Corbin Ade, MD;  Location: [facility];  Service: Endoscopy;  Laterality: N/A;  3:00pm  . TONSILLECTOMY      Current Outpatient Medications  Medication Sig Dispense Refill  . norgestimate-ethinyl estradiol (MONO-LINYAH) 0.25-35 MG-MCG tablet Take 1 tablet by mouth daily.    . ondansetron (ZOFRAN) 4 MG tablet Take 1 tablet (4 mg total) by mouth every 8 (eight) hours as needed for nausea or vomiting. 18 tablet 1  . pantoprazole (PROTONIX) 40 MG tablet Take 1 tablet (40 mg total) by mouth daily. 30 tablet 3  . promethazine (PHENERGAN) 25 MG suppository Place 25 mg rectally every 6 (six) hours as needed for nausea or vomiting.     No current facility-administered medications for this visit.    Allergies as of 07/04/2019  . (No Known Allergies)    Family History  Problem Relation Age of Onset  . Colon cancer Neg Hx   . Inflammatory bowel disease Neg Hx  Social History   Socioeconomic History  . Marital status: Single    Spouse name: Not on file  . Number of children: Not on file  . Years of education: Not on file  . Highest education level: Not on file  Occupational History  . Not on file  Tobacco Use  . Smoking status: Former Smoker    Packs/day: 0.25    Years: 4.00    Pack years: 1.00    Types: Cigarettes    Quit date: 10/27/2018    Years since quitting: 0.6  .  Smokeless tobacco: Never Used  . Tobacco comment: vapes  Substance and Sexual Activity  . Alcohol use: Not Currently  . Drug use: Yes    Types: Marijuana    Comment: 3-4 times a month  . Sexual activity: Yes    Birth control/protection: Pill  Other Topics Concern  . Not on file  Social History Narrative  . Not on file   Social Determinants of Health   Financial Resource Strain:   . Difficulty of Paying Living Expenses: Not on file  Food Insecurity:   . Worried About Programme researcher, broadcasting/film/video in the Last Year: Not on file  . Ran Out of Food in the Last Year: Not on file  Transportation Needs:   . Lack of Transportation (Medical): Not on file  . Lack of Transportation (Non-Medical): Not on file  Physical Activity:   . Days of Exercise per Week: Not on file  . Minutes of Exercise per Session: Not on file  Stress:   . Feeling of Stress : Not on file  Social Connections:   . Frequency of Communication with Friends and Family: Not on file  . Frequency of Social Gatherings with Friends and Family: Not on file  . Attends Religious Services: Not on file  . Active Member of Clubs or Organizations: Not on file  . Attends Banker Meetings: Not on file  . Marital Status: Not on file    Review of Systems: General: Negative for anorexia, weight loss, fever, chills, fatigue, weakness. Eyes: Negative for vision changes.  ENT: Negative for hoarseness, difficulty swallowing , nasal congestion. CV: Negative for chest pain, angina, palpitations, dyspnea on exertion, peripheral edema.  Respiratory: Negative for dyspnea at rest, dyspnea on exertion, cough, sputum, wheezing.  GI: See history of present illness. GU:  Negative for dysuria, hematuria, urinary incontinence, urinary frequency, nocturnal urination.  MS: Negative for joint pain, low back pain.  Derm: Negative for rash or itching.  Neuro: Negative for weakness, abnormal sensation, seizure, frequent headaches, memory loss,  confusion.  Psych: Negative for anxiety, depression, suicidal ideation, hallucinations.  Endo: Negative for unusual weight change.  Heme: Negative for bruising or bleeding. Allergy: Negative for rash or hives.   Physical Exam: There were no vitals taken for this visit. General:   Alert and oriented. Pleasant and cooperative. Well-nourished and well-developed.  Head:  Normocephalic and atraumatic. Eyes:  Without icterus, sclera clear and conjunctiva pink.  Ears:  Normal auditory acuity. Mouth:  No deformity or lesions, oral mucosa pink.  Throat/Neck:  Supple, without mass or thyromegaly. Cardiovascular:  S1, S2 present without murmurs appreciated. Normal pulses noted. Extremities without clubbing or edema. Respiratory:  Clear to auscultation bilaterally. No wheezes, rales, or rhonchi. No distress.  Gastrointestinal:  +BS, soft, non-tender and non-distended. No HSM noted. No guarding or rebound. No masses appreciated.  Rectal:  Deferred  Musculoskalatal:  Symmetrical without gross deformities. Normal posture. Skin:  Intact  without significant lesions or rashes. Neurologic:  Alert and oriented x4;  grossly normal neurologically. Psych:  Alert and cooperative. Normal mood and affect. Heme/Lymph/Immune: No significant cervical adenopathy. No excessive bruising noted.    07/04/2019 7:46 AM   Disclaimer: This note was dictated with voice recognition software. Similar sounding words can inadvertently be transcribed and may not be corrected upon review.

## 2019-07-06 NOTE — Progress Notes (Signed)
Primary Care Physician:  Patient, [name] Primary GI Physician: Dr. Gala Romney  Chief Complaint  Patient presents with  . Abdominal Pain    lower abdomne, period like cramping  . Constipation    can go without BM for 2 weeks, tried miralax and dulcolax. When she takes this combo it makes her very gassy.   . Nausea    wakes up around 3am with the nausea, then right when she wakes up fro the morning. Wants refill zofran    HPI:   Madison Page is a 24 y.o. female presenting today for follow-up.    She was last seen in our office on 03/25/2019 for lower abdominal pain, nausea with vomiting, diarrhea, and questionably one episode of hematochezia.  She had acute onset of symptoms on 03/18/2019.  Per patient, diarrhea resolved within 24 hours.  Went to urgent care urgent care on 03/19/2019 and prescribed Cipro and Flagyl.  Presented to the ED on 03/20/2019 with persistent abdominal pain and vomiting.  Labs including CBC, CMP, lipase, urinalysis, urine pregnancy test unrevealing.  CT abdomen and pelvis with contrast negative for appendicitis or bowel obstruction.  Slight wall thickening of the gastric antrum.  He had been given fluids, antiemetics, Reglan, and pain medications in the ED and discharged with Phenergan.  At the time of her office visit, she had not had a bowel movement in 1 week.  Lower abdominal pain was slowly improving with the day of her office visit being the first day without pain. No vomiting in the last 24 hours. Nausea continued. No other upper GI symptoms.  Suspected patient may have had an acute infectious gastroenteritis that was resolving.  Query whether nausea was related to underlying gastritis with history of NSAID use.  Possible obstipation due to frequent diarrhea on Friday and decreased oral intake.  Plans were to start Protonix 40 mg daily, Zofran as needed, abdominal x-ray to evaluate obstipation, CBC and BMP, avoid NSAIDs, EGD to evaluate gastric wall thickening.   Would continue to monitor for any recurrence of rectal bleeding.  Abdominal x-ray on 03/25/2019 without obstruction.  She was advised to start MiraLAX 1 capful daily. Labs were not completed. EGD on 03/31/2019 with normal esophagus, erosive gastropathy s/p biopsied, duodenal erosions.  Recommendations to continue Protonix daily, avoid NSAID products.  Pathology with mild reactive gastropathy, negative for H. Pylori.  Patient called on 04/26/2019 reporting nausea with Zofran not helping as much.  Also with intermittent lower abdominal cramping, improved with Tylenol.  She denied vomiting.  BMs once weekly taking MiraLAX daily.  Patient reported MiraLAX upset her stomach and cause abdominal pain.  Patient was advised to pick up samples of Linzess 145 mcg daily and stop MiraLAX.   Today:   Constipation: Only with 1 small BM within the last 2 weeks. Stools are hard and requiring straining. Taking MiraLAX daily.   This causes gas. Also taking a stool softener couple times a week.  No blood in the stool or black stools. Has lower abdominal pain, feels like period cramps. Notices this more after taking birth control. Will last a few minutes and then resolve.  Does not return until she takes her birth control the next day.  Hasn't had a period since November after starting the new birth control. Took a pregnancy test and was negative.  Has follow-up with GYN soon.  Otherwise, no significant abdominal pain. Tried Linzess 145 mcg. Didn't think this helped much. Took it for 4 days. Did not  have a BM.   Nausea: Nausea continues. Not all day. More early morning and the first hour of the day. Will wake up out of sleep feeling nauseous. May lay down on the cold floor or eat a mint. Will take Zofran and this resolves the nausea. No vomiting. If she is hungry, she is more nauseous.  Somewhat of a gnawing feeling.  Birth control has been changed. This helped some with the nausea. Maybe ibuprofen once a week. No other  NSAIDs. Typically will try tylenol. Not taking Protonix. Has been without this for about 1.5 months. Didn't feel like it was helping much but then states it may have been helping. Works until [place] around 12-1 am and goes to bed around 2 am. Works in [employer] and eats the food there. Eats a lot of mac and cheese, this is her favorite food. Fast food 3-4 times a week. Typically chicken nuggets. No GERD symptoms. No dysphagia.    Has started working out again as she has started to feel better.  She has lost a few more pounds.  This is intentional.  States she is trying to tone up and look good in her uniform at [employer].   Past Medical History:  Diagnosis Date  . Asthma     Past Surgical History:  Procedure Laterality Date  . BIOPSY  03/31/2019   Procedure: BIOPSY;  Surgeon: Daneil Dolin, MD; erosive gastropathy biopsied.  Pathology with mild reactive gastropathy, negative H. pylori.  . ESOPHAGOGASTRODUODENOSCOPY (EGD) WITH PROPOFOL N/A 03/31/2019   Procedure: ESOPHAGOGASTRODUODENOSCOPY (EGD) WITH PROPOFOL;  Surgeon: Daneil Dolin, MD; erosive gastropathy s/p biopsy, duodenal erosions.  Pathology with mild reactive gastropathy, negative for H. pylori.  . TONSILLECTOMY      Current Outpatient Medications  Medication Sig Dispense Refill  . docusate sodium (COLACE) 100 MG capsule Take 100 mg by mouth. 1-2 times per week    . FIBER PO Take 2 tablets by mouth daily.    . LO LOESTRIN FE 1 MG-10 MCG / 10 MCG tablet Take 1 tablet by mouth daily.    . ondansetron (ZOFRAN) 4 MG tablet Take 1 tablet (4 mg total) by mouth every 8 (eight) hours as needed for nausea or vomiting. 18 tablet 2  . polyethylene glycol (MIRALAX / GLYCOLAX) 17 g packet Take 17 g by mouth. Every 2 days    . pantoprazole (PROTONIX) 40 MG tablet Take 1 tablet (40 mg total) by mouth daily before breakfast. 30 tablet 5   No current facility-administered medications for this visit.    Allergies as of 07/07/2019  . (No  Known Allergies)    Family History  Problem Relation Age of Onset  . Colon cancer Neg Hx   . Inflammatory bowel disease Neg Hx     Social History   Socioeconomic History  . Marital status: Single    Spouse name: Not on file  . Number of children: Not on file  . Years of education: Not on file  . Highest education level: Not on file  Occupational History  . Not on file  Tobacco Use  . Smoking status: Former Smoker    Packs/day: 0.25    Years: 4.00    Pack years: 1.00    Types: Cigarettes    Quit date: 10/27/2018    Years since quitting: 0.6  . Smokeless tobacco: Never Used  . Tobacco comment: vapes  Substance and Sexual Activity  . Alcohol use: Not Currently  . Drug  use: Yes    Types: Marijuana    Comment: 3-4 times a month  . Sexual activity: Yes    Birth control/protection: Pill  Other Topics Concern  . Not on file  Social History Narrative  . Not on file   Social Determinants of Health   Financial Resource Strain:   . Difficulty of Paying Living Expenses: Not on file  Food Insecurity:   . Worried About Charity fundraiser in the Last Year: Not on file  . Ran Out of Food in the Last Year: Not on file  Transportation Needs:   . Lack of Transportation (Medical): Not on file  . Lack of Transportation (Non-Medical): Not on file  Physical Activity:   . Days of Exercise per Week: Not on file  . Minutes of Exercise per Session: Not on file  Stress:   . Feeling of Stress : Not on file  Social Connections:   . Frequency of Communication with Friends and Family: Not on file  . Frequency of Social Gatherings with Friends and Family: Not on file  . Attends Religious Services: Not on file  . Active Member of Clubs or Organizations: Not on file  . Attends Archivist Meetings: Not on file  . Marital Status: Not on file    Review of Systems: Gen: Denies fever, chills, lightheadedness, dizziness, presyncope, or syncope. CV: Denies chest pain or heart  palpitations. Resp: Denies shortness of breath or cough. GI: See HPI Heme: See HPI  Physical Exam: BP 115/82   Pulse 75   Temp (!) 97.5 F (36.4 C) (Oral)   Ht _0  (1.753 m)   Wt 133 lb 9.6 oz (60.6 kg)   LMP 05/09/2019   BMI 19.73 kg/m  General:   Alert and oriented. No distress noted. Pleasant and cooperative.  Head:  Normocephalic and atraumatic. Eyes:  Conjuctiva clear without scleral icterus. Heart:  S1, S2 present without murmurs appreciated. Lungs:  Clear to auscultation bilaterally. No wheezes, rales, or rhonchi. No distress.  Abdomen:  +BS, soft, non-tender and non-distended. No rebound or guarding. No HSM or masses noted. Msk:  Symmetrical without gross deformities. Normal posture. Extremities:  Without edema. Neurologic:  Alert and  oriented x4 Psych: Normal mood and affect.

## 2019-07-07 ENCOUNTER — Other Ambulatory Visit: Payer: Self-pay

## 2019-07-07 ENCOUNTER — Encounter: Payer: Self-pay | Admitting: Gastroenterology

## 2019-07-07 ENCOUNTER — Ambulatory Visit (INDEPENDENT_AMBULATORY_CARE_PROVIDER_SITE_OTHER): Payer: No Typology Code available for payment source | Admitting: Gastroenterology

## 2019-07-07 VITALS — BP 115/82 | HR 75 | Temp 97.5°F | Ht 69.0 in | Wt 133.6 lb

## 2019-07-07 DIAGNOSIS — K5900 Constipation, unspecified: Secondary | ICD-10-CM | POA: Insufficient documentation

## 2019-07-07 DIAGNOSIS — R110 Nausea: Secondary | ICD-10-CM | POA: Diagnosis not present

## 2019-07-07 DIAGNOSIS — K319 Disease of stomach and duodenum, unspecified: Secondary | ICD-10-CM | POA: Diagnosis not present

## 2019-07-07 DIAGNOSIS — R1030 Lower abdominal pain, unspecified: Secondary | ICD-10-CM | POA: Diagnosis not present

## 2019-07-07 MED ORDER — ONDANSETRON HCL 4 MG PO TABS: 4.0000 mg | ORAL_TABLET | Freq: Three times a day (TID) | ORAL | 2 refills | Status: DC | PRN

## 2019-07-07 MED ORDER — PANTOPRAZOLE SODIUM 40 MG PO TBEC: 40.0000 mg | DELAYED_RELEASE_TABLET | Freq: Every day | ORAL | 5 refills | Status: DC

## 2019-07-07 NOTE — Assessment & Plan Note (Signed)
Patient had acute onset of lower abdominal pain in the setting of likely acute viral gastroenteritis in October 2020 as she had associated nausea, vomiting, and diarrhea.  Lower abdominal pain is improved.  Currently, has lower abdominal pain that feels like a period cramp most notably after taking her birth control.  Of note, she just started this new birth control in November 2020.  Abdominal pain will resolve after a few minutes and does not return until the next day.  She does also continue with underlying constipation that is not well managed which may also be playing a role.  Denies bright red blood per rectum or melena.  He has had intentional weight loss as she has resumed exercising and states she is trying to tone up.  Abdominal exam is benign.  Trial Linzess 290 mcg daily for constipation.  Samples provided.  Patient is to call in 1-2 weeks with an update.  If this works well, will send in a prescription. Drink enough water to keep urine pale yellow to clear. Increase fiber intake.  Try adding Benefiber or Metamucil daily. Continue to monitor symptoms. Follow-up in 6 months.  Call if questions or concerns prior.

## 2019-07-07 NOTE — Assessment & Plan Note (Addendum)
Not ideally managed.  Currently with 1 small bowel movement every couple of weeks. Constipation started after an acute episode of diarrhea back in October 2020.  She is without alarm symptoms.  CT abdomen and pelvis with contrast at the time of diarrhea onset without significant findings other than slight wall thickening of the gastric antrum which has been evaluated with EGD.  Abdominal x-ray on 03/25/19 for obstipation without obstruction.  She has been taking MiraLAX 1 capful daily and stool softener couple times a week.  States she tried Linzess 145 mcg for 4 days without a BM.    At this time, we will trial Linzess 290 mcg daily.  Samples provided.  Patient is to call with an update in 1-2 weeks.  If this works well, will send in a prescription. Drink enough water to keep urine pale yellow to clear. Increase fiber intake.  Try adding Benefiber or Metamucil daily. Constipation handout provided. Follow-up in 6 months.  Call if questions or concerns prior.

## 2019-07-07 NOTE — Assessment & Plan Note (Addendum)
Suspect patient had acute viral gastroenteritis in October 2020 as she had acute onset of nausea, vomiting, diarrhea, and lower abdominal pain.  CT abdomen and pelvis at the time of acute illness with slight wall thickening of the gastric antrum.  She underwent EGD on 03/31/2019 with erosive gastropathy s/p biopsied, duodenal erosions.  Pathology with mild reactive gastropathy, negative for H. pylori.  Overall, symptoms are much improved but she does continue with intermittent nausea without vomiting.  Will wake from sleep in the early morning hours with nausea.  When getting up in the morning, nausea will last about an hour.  Also notes nausea is somewhat worse if she is hungry.  Zofran works well.  Has not been taking Protonix for about 1.5 months.  Eats dinner around 12-1 AM and goes to bed around 2 AM.  Fast food 3-4 times a week.  Denies GERD symptoms.  As patient's nausea is primarily in the early morning hours, I suspect her symptoms are likely related to her dietary/lifestyle habits as well as gastropathy noted on EGD.  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  Constipation is also inadequately managed which may also be playing a role.  This is addressed below.  Resume Protonix 40 mg daily 30 minutes before breakfast.  Prescription sent to pharmacy. Avoid all NSAIDs including ibuprofen, Aleve, Advil, and Goody powders. Follow a low-fat diet.  Avoid fried, fatty, greasy foods.  Also try avoiding spicy, citrus foods.  Avoid carbonated beverages and caffeine.   Do not eat within 3 hours of laying down.  Try propping the head of bed up on wood or bricks to create a 6 inch incline. Continue Zofran as needed for nausea.  Prescription refilled. Follow-up in 6 months.  Call if questions or concerns prior.  Could consider gastric emptying study if these measures do not improve her symptoms.

## 2019-07-07 NOTE — Patient Instructions (Addendum)
Please resume Protonix 40 mg daily 30 minutes before breakfast.  Continue to avoid all NSAIDs including ibuprofen, Aleve, Advil, and Goody powders.  Follow a low-fat diet.  Avoid fried, fatty, greasy foods.  You should also try avoiding spicy, citrus foods.  Avoid carbonated beverages and caffeine.    Do not eat within 3 hours of laying down.  Try propping the head of your bed up on wood or bricks to create a 6 inch incline.  I am refilling Zofran.  You may continue to use this as needed for nausea.  For constipation, we will provide samples of Linzess 290 mcg.  You will take this daily on empty stomach.  Please call with a progress report in 1-2 weeks.  If this works well, I will send in a prescription.  If this does not work well, we will consider other options.  Ensure you are drinking plenty of water to keep your urine pale yellow to clear.  Increase your daily fiber intake.  Consider adding a daily fiber supplement such as Benefiber or Metamucil.   We will plan to follow-up with you in 6 months.  Call if you have questions or concerns prior.  Ermalinda Memos, PA-C [facility]    Constipation, Adult Constipation is when a person:  Poops (has a bowel movement) fewer times in a week than normal.  Has a hard time pooping.  Has poop that is dry, hard, or bigger than normal. Follow these instructions at home: Eating and drinking   Eat foods that have a lot of fiber, such as: ? Fresh fruits and vegetables. ? Whole grains. ? Beans.  Eat less of foods that are high in fat, low in fiber, or overly processed, such as: ? [place] fries. ? Hamburgers. ? Cookies. ? Candy. ? Soda.  Drink enough fluid to keep your pee (urine) clear or pale yellow. General instructions  Exercise regularly or as told by your doctor.  Go to the restroom when you feel like you need to poop. Do not hold it in.  Take over-the-counter and prescription medicines only as told by your  doctor. These include any fiber supplements.  Do pelvic floor retraining exercises, such as: ? Doing deep breathing while relaxing your lower belly (abdomen). ? Relaxing your pelvic floor while pooping.  Watch your condition for any changes.  Keep all follow-up visits as told by your doctor. This is important. Contact a doctor if:  You have pain that gets worse.  You have a fever.  You have not pooped for 4 days.  You throw up (vomit).  You are not hungry.  You lose weight.  You are bleeding from the anus.  You have thin, pencil-like poop (stool). Get help right away if:  You have a fever, and your symptoms suddenly get worse.  You leak poop or have blood in your poop.  Your belly feels hard or bigger than normal (is bloated).  You have very bad belly pain.  You feel dizzy or you faint. This information is not intended to replace advice given to you by your health care provider. Make sure you discuss any questions you have with your health care provider. Document Revised: 05/15/2017 Document Reviewed: 11/21/2015 Elsevier Patient Education  2020 [employer].

## 2019-07-19 ENCOUNTER — Other Ambulatory Visit: Payer: Self-pay | Admitting: Gastroenterology

## 2019-07-19 ENCOUNTER — Telehealth: Payer: Self-pay | Admitting: Internal Medicine

## 2019-07-19 DIAGNOSIS — K5900 Constipation, unspecified: Secondary | ICD-10-CM

## 2019-07-19 DIAGNOSIS — K319 Disease of stomach and duodenum, unspecified: Secondary | ICD-10-CM

## 2019-07-19 MED ORDER — LINACLOTIDE 290 MCG PO CAPS: 290.0000 ug | ORAL_CAPSULE | Freq: Every day | ORAL | 3 refills | Status: DC

## 2019-07-19 NOTE — Telephone Encounter (Signed)
Rx sent 

## 2019-07-19 NOTE — Telephone Encounter (Signed)
Pt said her samples of Linzess are working and she needs a prescription called into [facility] on [address].

## 2019-07-19 NOTE — Telephone Encounter (Signed)
Pt is taking Linzess 290 mcg and would like RX sent to her pharmacy.

## 2019-07-19 NOTE — Telephone Encounter (Signed)
Spoke with pt. Pt notified that RX was sent to her pharmacy.

## 2019-07-26 ENCOUNTER — Telehealth: Payer: Self-pay | Admitting: Internal Medicine

## 2019-07-26 NOTE — Telephone Encounter (Signed)
Spoke with pt. Linzess is the only prescription medication pt has tried. Pt's drug plan asked if pt has tried Trulance (tier 1 drug, or Amitiza 2 tier drug). Both medications require a PA. Pt was asked to pickup a Linzess coupon card, acitivate it to see if the cost of her medication is reduced. If pt isn't able to use the Linzess card, we can submit a PA or see if medication needs to be changed.

## 2019-07-26 NOTE — Telephone Encounter (Signed)
Patient was calling to follow up on her Linzess PA. [phone]

## 2019-07-28 ENCOUNTER — Telehealth: Payer: Self-pay | Admitting: Internal Medicine

## 2019-07-28 NOTE — Telephone Encounter (Signed)
[phone]  PLEASE CALL PATIENT, HAS ITCHING AND BURNING IN RECTAL AREA.  ALSO SEES MUCOUS IN STOOL.  WANTS TO KNOW IF SHE NEEDS TO SCHEDULE AN APPOINTMENT, OR IF WE CAN CALL SOMETHING IN

## 2019-07-28 NOTE — Telephone Encounter (Signed)
Spoke with pt. Pt is having constant itching and burning at her rectum. Pt isn't sure if it's hemorrhoids or what could be causing the itching. Pt is having small bowel movements and added a suppository to see if it would stop the itching. Pt isn't having a lot of pain near her rectum and doesn't feel anything protruding out of her rectum.  Pt hasn't applied any topical creams to her rectum. FYI pt is going to start Linzess 290 mcg to help with her bowel movements. Pt was going to pick up the prescription savings card this week to see if it would lower the cost of medication.

## 2019-07-28 NOTE — Telephone Encounter (Signed)
Any bright red blood per rectum? As she has been struggling with constipation, I suspect it is likely related to hemorrhoids. She could try Preparation H BID x 7-10 days to see if this helps. If no significant improvement, then she would need to be seen for further evaluation.   Please let me know if there is trouble getting Linzess filled. She needs to be on something to help regulate her bowel habits.

## 2019-07-28 NOTE — Telephone Encounter (Signed)
Noted  

## 2019-07-28 NOTE — Telephone Encounter (Signed)
FYI Pt isn't seeing any bright red blood or blood with wiping. She is going to try otc Preparation H bid x 7-1- days. Pt will call back for evaluation if not improving. Pt is stopping by today to pickup Linzess card and will contact me if it doesn't help.

## 2019-08-24 ENCOUNTER — Telehealth: Payer: Self-pay | Admitting: Internal Medicine

## 2019-08-24 NOTE — Telephone Encounter (Signed)
Spoke with pt. I will discuss with her insurance company. Pt isn't sure what medications are covered or if she has a high deductible. Pt was advised to use the rebate cards since these medications can be expensive on some insurance plans. Pt stated you have to use a new card q3 months and I let pt know that our office can send new cards when needed or pt can visit the website.

## 2019-08-24 NOTE — Telephone Encounter (Signed)
Pt said her insurance will not cover LInzess and was there anything else cheaper to try instead. She wants to change pharmacies and asked for a new prescription to go to [facility] in [place]. [phone]

## 2019-09-12 NOTE — Telephone Encounter (Signed)
Lmom. Waiting on a return call.  

## 2019-09-13 NOTE — Telephone Encounter (Signed)
Noted. I am glad she is doing better. Ok with her continuing MiraLAX daily as this is working well.

## 2019-09-13 NOTE — Telephone Encounter (Signed)
Pt returned call. Pt is aware that Linzess wasn't able to get covered under insurance due to not trying and failing other prescriptions. Pt is aware that there are Linzess RX cards that can be used to help with payment. Pt has changed her diet and increased her fiber intake. Since changing her diet, pt is having a BM daily, nausea has improved and pt has added once dose of Miralax daily. Pt is ok with containing this regimen since it's working for her.

## 2019-09-14 NOTE — Telephone Encounter (Signed)
Noted. Pt is aware 

## 2019-11-08 ENCOUNTER — Other Ambulatory Visit: Payer: Self-pay

## 2019-11-08 ENCOUNTER — Encounter: Payer: Self-pay | Admitting: Gastroenterology

## 2019-11-08 ENCOUNTER — Ambulatory Visit (INDEPENDENT_AMBULATORY_CARE_PROVIDER_SITE_OTHER): Payer: No Typology Code available for payment source | Admitting: Gastroenterology

## 2019-11-08 VITALS — BP 128/90 | HR 112 | Temp 97.7°F | Ht 69.0 in | Wt 124.0 lb

## 2019-11-08 DIAGNOSIS — K5900 Constipation, unspecified: Secondary | ICD-10-CM

## 2019-11-08 DIAGNOSIS — R110 Nausea: Secondary | ICD-10-CM | POA: Diagnosis not present

## 2019-11-08 DIAGNOSIS — R634 Abnormal weight loss: Secondary | ICD-10-CM | POA: Diagnosis not present

## 2019-11-08 NOTE — Progress Notes (Signed)
Referring Provider: No ref. provider found Primary Care Physician:  Patient, [name] Primary GI: Dr. Jena Gauss    Chief Complaint  Patient presents with  . no energy    no appetite on monday  . Constipation    eating lots of fiber and using flaxseed with most meals, helps with BM  . Nausea    but no vomiting    HPI:   Madison Page is a 24 y.o. female presenting today with a history of abdominal pain, nausea, constipation, s/p EGD on 03/31/2019 with normal esophagus, erosive gastropathy s/p biopsied, duodenal erosions. Pathology with mild reactive gastropathy, negative for H. Pylori  Chronic constipation: Linzess 290 mcg daily in the past. Insurance did not cover this well and was still pricey despite savings card.     Weight loss noted. Previously weighing 140 in Oct 2020, 133 in Jan 2021, and now 124. A year ago was 153 per patient. Had started losing weight just with exercising at first. Now unintentional.   Feels weak and shakey all the time. If not eating every few hours will feel empty. Putting flax seed in meals, having a few small BMs a day. Started to feel normal with that. Constipated now. Started taking Miralax a few nights ago. Feels like a probiotic helps with moving things through.    Wonders about gluten sensitivity or lactose intolerance. Last few nights had gluten and was ok. Doesn't drink milk, no iced coffee. Only dairy she has is cheese.   Symptoms come and go. No appetite, + nausea, irregular bowel movements. Past few nights had a full meal, eating snacks every few hours. Eating every few hours has made her feel better. Fiber One bars, raw veggies. No diarrhea.   Monday with nausea, emptiness, feeling malnourished. Even taking a sip of water felt heavy. Insurance doesn't cover Linzess. With Linzess was more on schedule for BMs. At one point was having to do Miralax BID. Makes her feel gassy. Last productive BMs was a few weeks ago. BMs are smaller  pieces throughout the day. A few weeks ago was eating higher fiber foods (cereal). FEels like she needs more fiber. Notes irregular if not taking in adequately. No rectal bleeding. Feels shaky if not eating. Feels shaky if standing up. When not eating, feels nauseated. Have abdominal pain if trying to have BM in lower abdomen. Sometimes will hurt for no reason. +burping.   End of November no further periods. Not taking Protonix any longer. Feels like it wasn't helpful. Was on for 4 months. No NSAID unless has a headache. Mostly bad in mornings. Sometimes afraid to eat because of worried about pain, nausea. Spicy foods will set it off. Carbonated drinks make it turn. Tried to take dulcolax but was painful.   Seeing Dr. Janna Arch on June 21st.   Past Medical History:  Diagnosis Date  . Asthma     Past Surgical History:  Procedure Laterality Date  . BIOPSY  03/31/2019   Procedure: BIOPSY;  Surgeon: Corbin Ade, MD; erosive gastropathy biopsied.  Pathology with mild reactive gastropathy, negative H. pylori.  . ESOPHAGOGASTRODUODENOSCOPY (EGD) WITH PROPOFOL N/A 03/31/2019   normal esophagus, erosive gastropathy s/p biopsied, duodenal erosions. Pathology with mild reactive gastropathy, negative for H. Pylori  . TONSILLECTOMY      Current Outpatient Medications  Medication Sig Dispense Refill  . LO LOESTRIN FE 1 MG-10 MCG / 10 MCG tablet Take 1 tablet by mouth daily.    Marland Kitchen  ondansetron (ZOFRAN) 4 MG tablet Take 1 tablet (4 mg total) by mouth every 8 (eight) hours as needed for nausea or vomiting. 18 tablet 2  . Probiotic Product (PROBIOTIC PO) Take by mouth daily.     No current facility-administered medications for this visit.    Allergies as of 11/08/2019  . (No Known Allergies)    Family History  Problem Relation Age of Onset  . Colon cancer Neg Hx   . Inflammatory bowel disease Neg Hx     Social History   Socioeconomic History  . Marital status: Single    Spouse name: Not on  file  . Number of children: Not on file  . Years of education: Not on file  . Highest education level: Not on file  Occupational History  . Not on file  Tobacco Use  . Smoking status: Former Smoker    Packs/day: 0.25    Years: 4.00    Pack years: 1.00    Types: Cigarettes    Quit date: 10/27/2018    Years since quitting: 1.0  . Smokeless tobacco: Never Used  . Tobacco comment: vapes  Substance and Sexual Activity  . Alcohol use: Yes    Comment: occas  . Drug use: Yes    Types: Marijuana    Comment: 3-4 times a month  . Sexual activity: Yes    Birth control/protection: Pill  Other Topics Concern  . Not on file  Social History Narrative  . Not on file   Social Determinants of Health   Financial Resource Strain:   . Difficulty of Paying Living Expenses:   Food Insecurity:   . Worried About Charity fundraiser in the Last Year:   . Arboriculturist in the Last Year:   Transportation Needs:   . Film/video editor (Medical):   Marland Kitchen Lack of Transportation (Non-Medical):   Physical Activity:   . Days of Exercise per Week:   . Minutes of Exercise per Session:   Stress:   . Feeling of Stress :   Social Connections:   . Frequency of Communication with Friends and Family:   . Frequency of Social Gatherings with Friends and Family:   . Attends Religious Services:   . Active Member of Clubs or Organizations:   . Attends Archivist Meetings:   Marland Kitchen Marital Status:     Review of Systems: As mentioned in HPI  Physical Exam: BP 128/90   Pulse (!) 112   Temp 97.7 F (36.5 C) (Oral)   Ht 5\' 9"  (1.753 m)   Wt 124 lb (56.2 kg)   BMI 18.31 kg/m  General:   Alert and oriented. No distress noted. Pleasant and cooperative.  Head:  Normocephalic and atraumatic. Eyes:  Conjuctiva clear without scleral icterus. Mouth:  Mask in place Abdomen:  +BS, soft, non-tender and non-distended. No rebound or guarding. No HSM or masses noted. Msk:  Symmetrical without gross  deformities. Normal posture. Extremities:  Without edema. Neurologic:  Alert and  oriented x4 Psych:  Alert and cooperative. Normal mood and affect.  ASSESSMENT: Madison Page is a 24 y.o. female presenting with history of nausea, constipation, notable weight loss, with EGD on file from Oct 2020 with erosive gastropathy, duodenal erosions, negative H.pylori, here for follow-up.  Nausea: unclear etiology at this point. Although she denies typical GERD, she does have burping, worsening symptoms with certain foods, and I query if underlying gastritis/GERD playing a role. Less likely gastroparesis. Will need to also  check urine pregnancy to be thorough. Will trial Dexilant as she noted no improvement with Protonix.  Constipation: will need to investigate what insurance covers. Linzess 290 mcg samples provided until we know what will be best covered.   Weight loss: concerning and multifactorial in setting of decreased oral intake due to concern for nausea and constipation. Check multiple labs today. May need updated imaging.    PLAN:  CBC, CMP, TSH, urine pregnancy, celiac serologies Dexilant samples provided Linzess 290 mcg samples provided: our office will inquire about what agent is covered Add Benefiber to diet Further recommendations after labs and urine pregnancy  Gelene Mink, PhD, ANP-BC [facility] Gastroenter[facility]

## 2019-11-08 NOTE — Patient Instructions (Signed)
I have given you samples of Linzess to take until we can figure out what your insurance covers.  I recommend Benefiber 2 teaspoons each day, and you can increase this as tolerated.  We are out of Dexilant samples, but we will save some for you when they come into the office.   Please have blood work done, and we will be in touch with the next steps!  It was a pleasure to see you today. I want to create trusting relationships with patients to provide genuine, compassionate, and quality care. I value your feedback. If you receive a survey regarding your visit,  I greatly appreciate you taking time to fill this out.   Gelene Mink, PhD, ANP-BC [facility] Gastroenterology

## 2019-11-09 LAB — CBC WITH DIFFERENTIAL/PLATELET
Absolute Monocytes: 331 cells/uL (ref 200–950)
Basophils Absolute: 29 cells/uL (ref 0–200)
Basophils Relative: 0.6 %
Eosinophils Absolute: 19 cells/uL (ref 15–500)
Eosinophils Relative: 0.4 %
HCT: 42.7 % (ref 35.0–45.0)
Hemoglobin: 14.2 g/dL (ref 11.7–15.5)
Lymphs Abs: 1814 cells/uL (ref 850–3900)
MCH: 30.8 pg (ref 27.0–33.0)
MCHC: 33.3 g/dL (ref 32.0–36.0)
MCV: 92.6 fL (ref 80.0–100.0)
MPV: 11.8 fL (ref 7.5–12.5)
Monocytes Relative: 6.9 %
Neutro Abs: 2606 cells/uL (ref 1500–7800)
Neutrophils Relative %: 54.3 %
Platelets: 291 10*3/uL (ref 140–400)
RBC: 4.61 10*6/uL (ref 3.80–5.10)
RDW: 11.2 % (ref 11.0–15.0)
Total Lymphocyte: 37.8 %
WBC: 4.8 10*3/uL (ref 3.8–10.8)

## 2019-11-09 LAB — COMPLETE METABOLIC PANEL WITH GFR
AG Ratio: 2 (calc) (ref 1.0–2.5)
ALT: 16 U/L (ref 6–29)
AST: 14 U/L (ref 10–30)
Albumin: 4.7 g/dL (ref 3.6–5.1)
Alkaline phosphatase (APISO): 32 U/L (ref 31–125)
BUN: 15 mg/dL (ref 7–25)
CO2: 27 mmol/L (ref 20–32)
Calcium: 9.6 mg/dL (ref 8.6–10.2)
Chloride: 102 mmol/L (ref 98–110)
Creat: 0.8 mg/dL (ref 0.50–1.10)
GFR, Est African American: 120 mL/min/{1.73_m2} (ref >=60)
GFR, Est Non African American: 104 mL/min/{1.73_m2} (ref >=60)
Globulin: 2.3 g/dL (calc) (ref 1.9–3.7)
Glucose, Bld: 86 mg/dL (ref 65–99)
Potassium: 4.3 mmol/L (ref 3.5–5.3)
Sodium: 138 mmol/L (ref 135–146)
Total Bilirubin: 0.6 mg/dL (ref 0.2–1.2)
Total Protein: 7 g/dL (ref 6.1–8.1)

## 2019-11-09 LAB — IRON,TIBC AND FERRITIN PANEL
%SAT: 35 % (calc) (ref 16–45)
Ferritin: 46 ng/mL (ref 16–154)
Iron: 102 ug/dL (ref 40–190)
TIBC: 291 mcg/dL (calc) (ref 250–450)

## 2019-11-09 LAB — TISSUE TRANSGLUTAMINASE, IGA: (tTG) Ab, IgA: 1 U/mL

## 2019-11-09 LAB — TSH: TSH: 0.69 mIU/L

## 2019-11-09 LAB — PREGNANCY, URINE: Preg Test, Ur: NEGATIVE

## 2019-11-09 LAB — IGA: Immunoglobulin A: 265 mg/dL (ref 47–310)

## 2019-11-09 LAB — VITAMIN D 25 HYDROXY (VIT D DEFICIENCY, FRACTURES): Vit D, 25-Hydroxy: 39 ng/mL (ref 30–100)

## 2019-11-14 ENCOUNTER — Other Ambulatory Visit: Payer: Self-pay | Admitting: Gastroenterology

## 2019-11-14 DIAGNOSIS — R110 Nausea: Secondary | ICD-10-CM

## 2019-11-15 ENCOUNTER — Other Ambulatory Visit: Payer: Self-pay | Admitting: *Deleted

## 2019-11-15 DIAGNOSIS — K5900 Constipation, unspecified: Secondary | ICD-10-CM

## 2019-11-15 DIAGNOSIS — R634 Abnormal weight loss: Secondary | ICD-10-CM

## 2019-11-15 NOTE — Progress Notes (Signed)
[name] patient 

## 2019-11-16 ENCOUNTER — Telehealth: Payer: Self-pay | Admitting: *Deleted

## 2019-11-16 NOTE — Telephone Encounter (Signed)
Rx sent 

## 2019-11-16 NOTE — Telephone Encounter (Signed)
Received refill request from [facility] for ondansetron 4mg  tab 1 po q8hrs prn

## 2019-12-02 ENCOUNTER — Ambulatory Visit: Payer: No Typology Code available for payment source | Admitting: Gastroenterology

## 2020-01-02 ENCOUNTER — Telehealth: Payer: Self-pay | Admitting: Emergency Medicine

## 2020-01-02 NOTE — Telephone Encounter (Signed)
Pa for ondansetron 4mg  completed Key: Need help? Call [id number] at [phone]  Outcome approved Additional Information Required  Your PA has been resolved, no additional PA is required. For further inquiries please contact the number on the back of the member prescription card. (Message [phone])  DrugOndansetron 4MG  dispersible tabl  pharmacy notified

## 2020-01-17 NOTE — Progress Notes (Signed)
Patient scheduled.

## 2020-01-30 ENCOUNTER — Ambulatory Visit: Payer: No Typology Code available for payment source | Admitting: Gastroenterology

## 2020-02-03 LAB — T3: T3, Total: 114 ng/dL (ref 76–181)

## 2020-02-03 LAB — T4, FREE: Free T4: 1.2 ng/dL (ref 0.8–1.8)

## 2020-02-06 ENCOUNTER — Telehealth: Payer: Self-pay | Admitting: Internal Medicine

## 2020-02-06 DIAGNOSIS — R110 Nausea: Secondary | ICD-10-CM

## 2020-02-06 MED ORDER — ONDANSETRON HCL 4 MG PO TABS: ORAL_TABLET | ORAL | 2 refills | Status: DC

## 2020-02-06 NOTE — Addendum Note (Signed)
Addended by: Gelene Mink on: 02/06/2020 12:38 PM   Modules accepted: Orders

## 2020-02-06 NOTE — Telephone Encounter (Signed)
Patient wanted to speak with nurse, but I accidentally transferred to wrong number. Please call her back at [phone]

## 2020-02-06 NOTE — Telephone Encounter (Signed)
Has she been taking Linzess? Needs to get on that daily. In interim, could take Miralax one capful every hour in 8 ounces of water up to 6 doses. Needs to get bowels moving. We need to bump up her appointment. I have sent in Zofran but take sparingly if possible as this contributes to constipation.

## 2020-02-06 NOTE — Telephone Encounter (Signed)
Called pt back.  She said that she is waking up every morning with nausea.  She is out of Zofran.  She says she is constipated and hasn't had a bm in 2 weeks.  She said that she had gained 5 to 8 lbs but has now lost it.  Feels full with no appetite.  Can't pass gas.  Eating small amounts of protein, chicken broth, and drinking small amounts of water.  She is eating slowly so it helps with hunger pains but still makes her sick.

## 2020-02-07 NOTE — Telephone Encounter (Signed)
Spoke with pt. Pt hasn't been taking Linzess. Pt previously took samples that worked at first. [employer] stopped working for pt and she stopped it due to have abdominal bloating/pain. Pt has tired Miralax but hasn't taking up to 6 doses with 8 ounces of water every hour. Pt is going to start the Miralax to get her bowels moving and is also aware that she can use Zofran sparingly.

## 2020-02-08 MED ORDER — LUBIPROSTONE 24 MCG PO CAPS: 24.0000 ug | ORAL_CAPSULE | Freq: Two times a day (BID) | ORAL | 3 refills | Status: DC

## 2020-02-08 NOTE — Telephone Encounter (Signed)
Received PA request from pharmacy- tried to do a PA last year (04/2019) for zofran and it was denied. Pt still has the same insurance. I contacted the pharmacy- had to leave a message- asked them to let us know if the insurance will still pay for #18 tablets for 21 days without a PA, which is what we did last year. Waiting for return call.

## 2020-02-08 NOTE — Telephone Encounter (Signed)
We can trial a different medication. She may do better on Amitiza. I can send this in for her. Amitiza 24 mcg po  BID with food to avoid nausea. I am sending in now just in case.

## 2020-02-08 NOTE — Addendum Note (Signed)
Addended by: Gelene Mink on: 02/08/2020 11:27 AM   Modules accepted: Orders

## 2020-02-09 ENCOUNTER — Telehealth: Payer: Self-pay | Admitting: Gastroenterology

## 2020-02-09 NOTE — Telephone Encounter (Signed)
Madison Page, can we bump up patient's appt to first available with me? THanks!

## 2020-02-09 NOTE — Telephone Encounter (Signed)
I added her to a cancellation list, all providers are close to being full through October

## 2020-02-10 NOTE — Telephone Encounter (Signed)
Tried to call pharmacy again- could not get a person on the phone. Waiting for PA approval on ondansetron. It is pending.  PA for Amitiza was approved.

## 2020-02-13 MED ORDER — PROMETHAZINE HCL 25 MG PO TABS: 12.5000 mg | ORAL_TABLET | Freq: Three times a day (TID) | ORAL | 0 refills | Status: DC | PRN

## 2020-02-13 NOTE — Addendum Note (Signed)
Addended by: Gelene Mink on: 02/13/2020 02:32 PM   Modules accepted: Orders

## 2020-02-13 NOTE — Telephone Encounter (Signed)
PA for zofran has been denied because the pt must has hyperemesis gravidarum, receiving radiation therapy or receiving emetogenic chemotherapy.

## 2020-02-13 NOTE — Telephone Encounter (Signed)
I sent in phenergan 12.5 mg to 25 mg (1/2 tablet to 1 tablet) every 8 hours for nausea/vomiting. This can cause drowsiness. Do not take while driving, operating machinery, etc.

## 2020-02-14 IMAGING — DX DG ABDOMEN 2V
2 series · 2 of 2 positions shown · non-contrast
Comparison: CT abdomen pelvis dated 03/20/2019

CLINICAL DATA: Lower abdominal pain for several days

EXAM:
ABDOMEN - 2 VIEW

[abdomen erect]
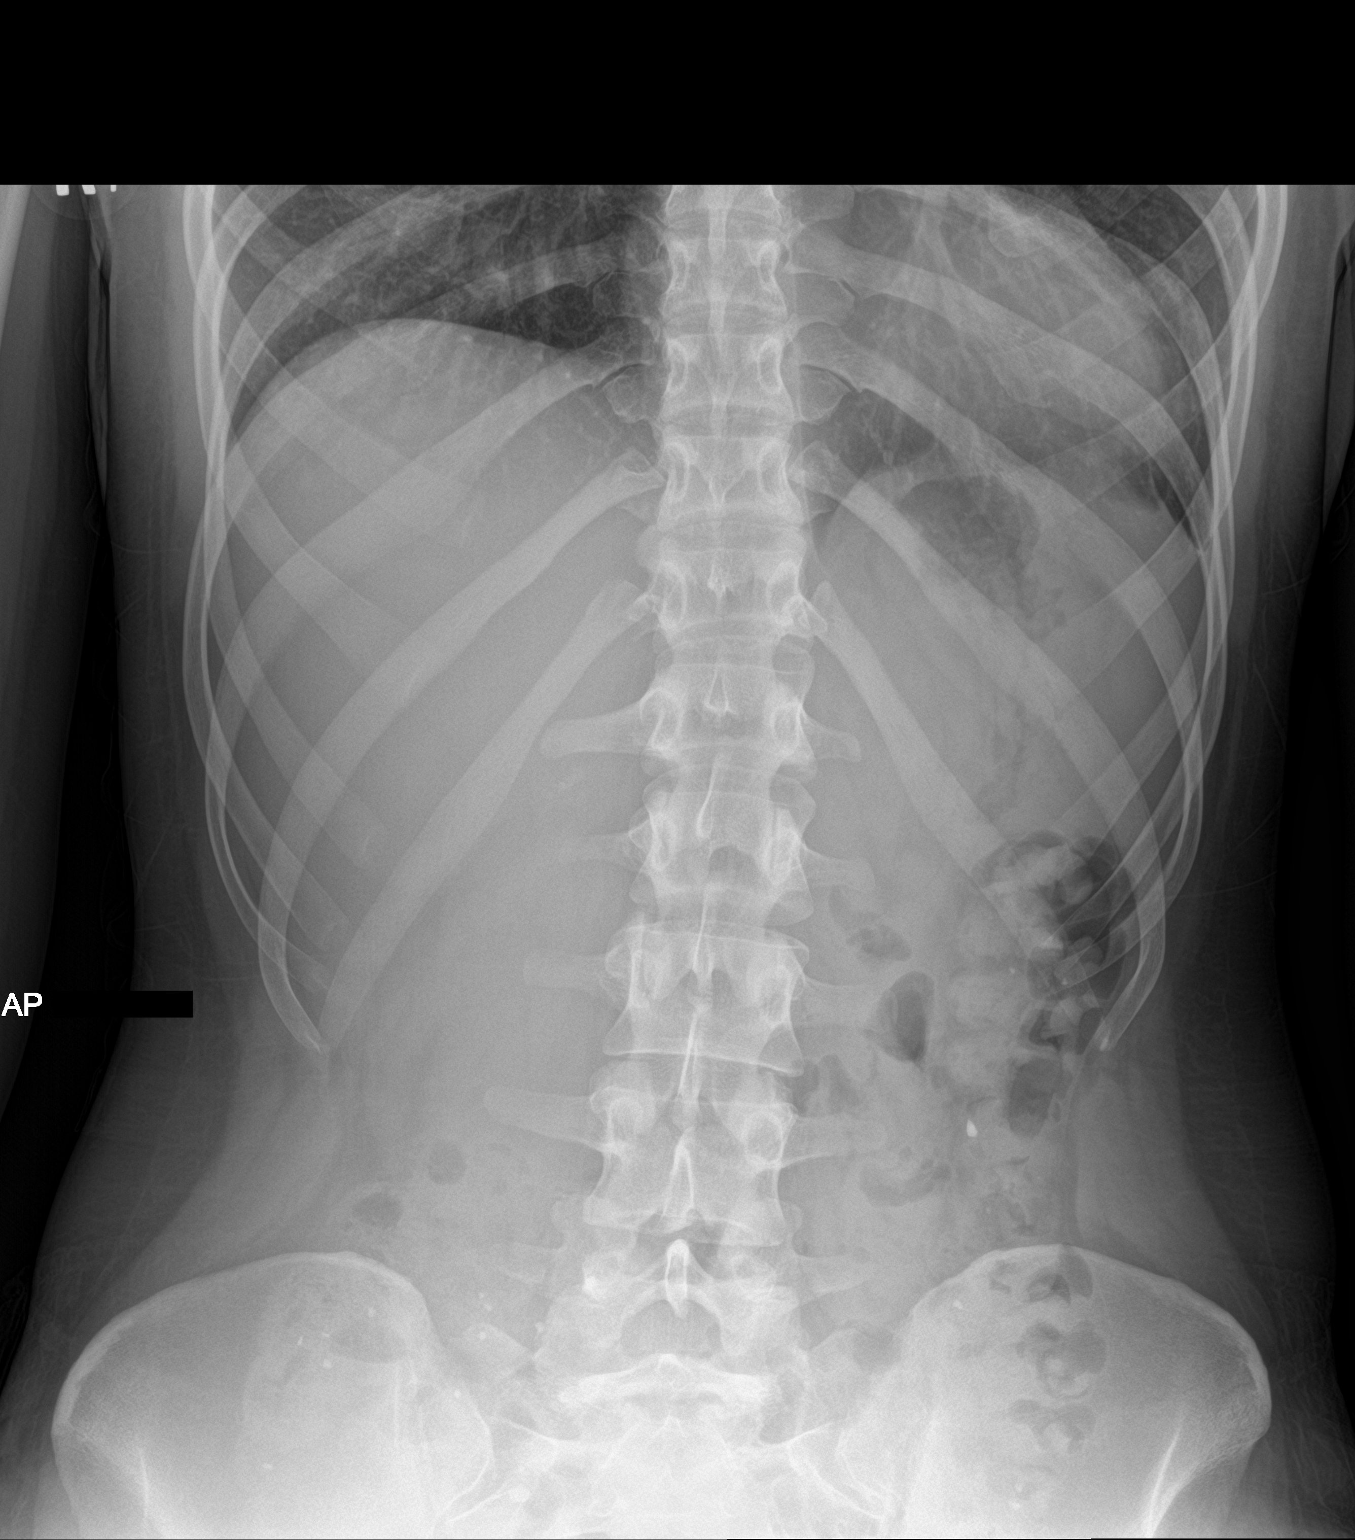

[abdomen supine]
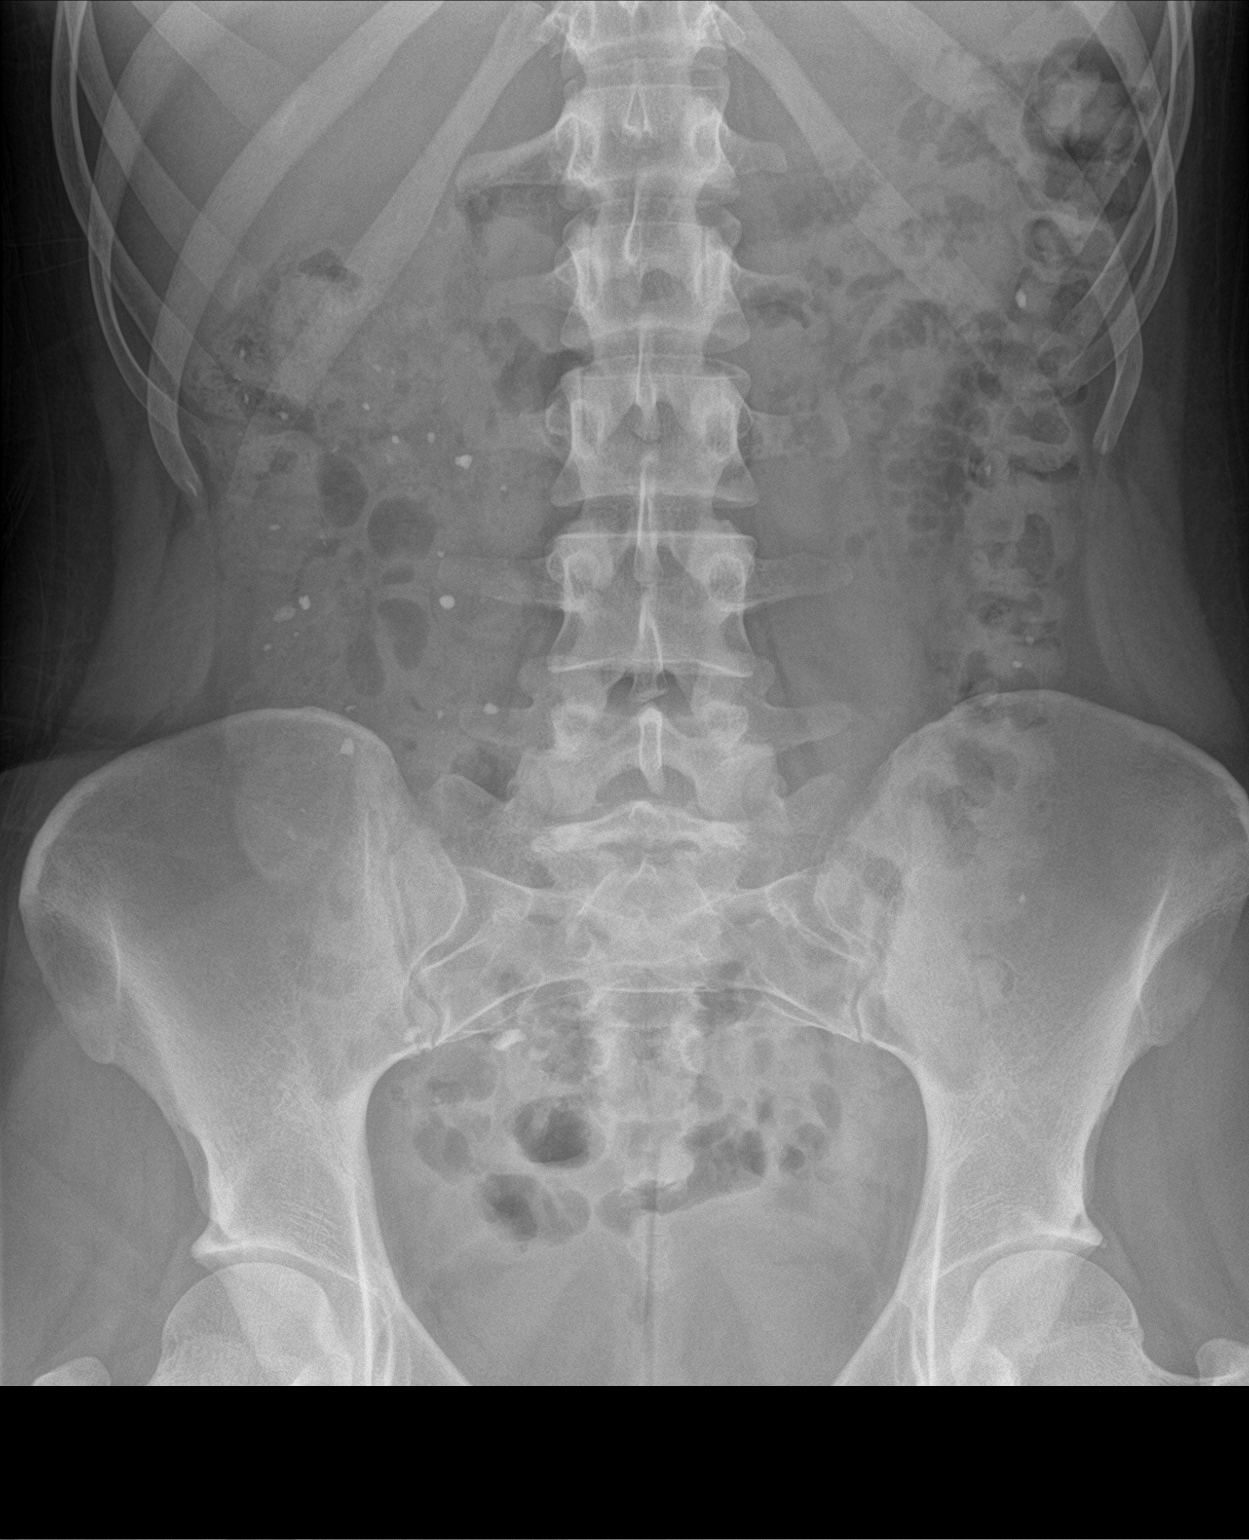

[2 of 2 positions shown; findings below may reference images not displayed]

FINDINGS: The bowel gas pattern is normal. There is no evidence of free air.
No radio-opaque calculi. Multiple radiopacities appear to be within
the bowel lumen.
IMPRESSION: Normal bowel gas pattern. No evidence of free air.

## 2020-02-16 NOTE — Telephone Encounter (Signed)
Pt called back and accepted 9/21 appointment and is aware we are going to cancel appt on 10/8. She has picked up phenergan rx and is aware it can make her sleepy. We discussed insurance not paying for zofran.

## 2020-02-16 NOTE — Telephone Encounter (Signed)
Tried to call pt- NA-LMOM. Per Anna's message in chart, pt also needs sooner appointment. Appt available on 9/21 at 3:00 with Tobi Bastos. Will ask if she can come when she returns call.

## 2020-03-06 ENCOUNTER — Ambulatory Visit (INDEPENDENT_AMBULATORY_CARE_PROVIDER_SITE_OTHER): Payer: No Typology Code available for payment source | Admitting: Gastroenterology

## 2020-03-06 ENCOUNTER — Encounter: Payer: Self-pay | Admitting: Gastroenterology

## 2020-03-06 ENCOUNTER — Other Ambulatory Visit: Payer: Self-pay

## 2020-03-06 VITALS — BP 93/57 | HR 52 | Temp 97.5°F | Ht 69.0 in | Wt 123.6 lb

## 2020-03-06 DIAGNOSIS — K5900 Constipation, unspecified: Secondary | ICD-10-CM | POA: Diagnosis not present

## 2020-03-06 DIAGNOSIS — R110 Nausea: Secondary | ICD-10-CM

## 2020-03-06 MED ORDER — PANTOPRAZOLE SODIUM 40 MG PO TBEC: 40.0000 mg | DELAYED_RELEASE_TABLET | Freq: Every day | ORAL | 3 refills | Status: DC

## 2020-03-06 NOTE — Patient Instructions (Signed)
Let's start Protonix again. Take this 30 minutes before eating (we can try it before lunch or dinner). It can take up to 2 weeks to take full effect. Let me know if you don't see much improvement, because we will change it up!  Let's continue Amitiza twice a day. Add Miralax 1 capful each evening with full glass of water.  Continue to push the water/fluids as you are doing.  Please message me with any concerns in the meantime! I would like to see you back in 3 months for close follow-up.  I enjoyed seeing you again today! As you know, I value our relationship and want to provide genuine, compassionate, and quality care. I welcome your feedback. If you receive a survey regarding your visit,  I greatly appreciate you taking time to fill this out. See you next time!  Gelene Mink, PhD, ANP-BC [facility] Gastroenterology

## 2020-03-06 NOTE — Progress Notes (Signed)
Referring Provider: No ref. provider found Primary Care Physician:  Patient, [name]  Primary GI: Dr. Jena Gauss  Chief Complaint  Patient presents with  . Nausea    worse in the mornings and midday  . Constipation    still ongoing, lots of straining in the morning. If she drinks a lot of water she can have a little movement. Trying to eat more fiber but feels she gets more bloated    HPI:   Madison Page is a 25 y.o. female presenting today with a history of abdominal pain, nausea, constipation, s/p EGD on 03/31/2019 with normal esophagus, erosive gastropathy s/p biopsied, duodenal erosions. Pathology with mild reactive gastropathy, negative for H. Pylori. Celiac serologies negative.   Nausea: gastritis/GERD playing a role. Zofran not covered by insurance. Not nauseated by end of day. Morning nausea starts all over again. Not taking Protonix any longer.   Constipation: Linzess not covered. Amitiza 25 mcg po BID. Feels sick and nauseated in mornings. Will sit on toilet in mornings and pass gas then feel better. Amitiza seems to be working better overall and she wants to continue this.   Weight loss: stable since Sept 2021. Weight 140 in Oct 2020, 133 in Jan 2021, and 124 in May 2021. Today 123.6.   Overall, she feels improved from prior appointments. Continues to drink plenty of fluids. She did note scant amount of hematochezia in the past with straining but none further. She wants to monitor for now.   Past Medical History:  Diagnosis Date  . Asthma     Past Surgical History:  Procedure Laterality Date  . BIOPSY  03/31/2019   Procedure: BIOPSY;  Surgeon: Corbin Ade, MD; erosive gastropathy biopsied.  Pathology with mild reactive gastropathy, negative H. pylori.  . ESOPHAGOGASTRODUODENOSCOPY (EGD) WITH PROPOFOL N/A 03/31/2019   normal esophagus, erosive gastropathy s/p biopsied, duodenal erosions. Pathology with mild reactive gastropathy, negative for H. Pylori  .  TONSILLECTOMY      Current Outpatient Medications  Medication Sig Dispense Refill  . LO LOESTRIN FE 1 MG-10 MCG / 10 MCG tablet Take 1 tablet by mouth daily.    . ondansetron (ZOFRAN) 4 MG tablet TAKE 1 TABLET(4 MG) BY MOUTH EVERY 8 HOURS AS NEEDED FOR NAUSEA OR VOMITING 60 tablet 2  . Probiotic Product (PROBIOTIC PO) Take by mouth daily.    . promethazine (PHENERGAN) 25 MG tablet Take 0.5 tablets (12.5 mg total) by mouth every 8 (eight) hours as needed for nausea or vomiting. Monitor for drowsiness. May take whole tablet if needed. 30 tablet 0  . lubiprostone (AMITIZA) 24 MCG capsule Take 1 capsule (24 mcg total) by mouth 2 (two) times daily with a meal. (Patient not taking: Reported on 03/06/2020) 60 capsule 3   No current facility-administered medications for this visit.    Allergies as of 03/06/2020  . (No Known Allergies)    Family History  Problem Relation Age of Onset  . Colon cancer Neg Hx   . Inflammatory bowel disease Neg Hx     Social History   Socioeconomic History  . Marital status: Single    Spouse name: Not on file  . Number of children: Not on file  . Years of education: Not on file  . Highest education level: Not on file  Occupational History  . Not on file  Tobacco Use  . Smoking status: Former Smoker    Packs/day: 0.25    Years: 4.00  Pack years: 1.00    Types: Cigarettes    Quit date: 10/27/2018    Years since quitting: 1.3  . Smokeless tobacco: Never Used  . Tobacco comment: vapes  Vaping Use  . Vaping Use: Every day  . Substances: Nicotine, Flavoring  . Devices: airbar  Substance and Sexual Activity  . Alcohol use: Yes    Comment: occas  . Drug use: Yes    Types: Marijuana    Comment: 3-4 times a month  . Sexual activity: Yes    Birth control/protection: Pill  Other Topics Concern  . Not on file  Social History Narrative  . Not on file   Social Determinants of Health   Financial Resource Strain:   . Difficulty of Paying Living  Expenses: Not on file  Food Insecurity:   . Worried About Programme researcher, broadcasting/film/video in the Last Year: Not on file  . Ran Out of Food in the Last Year: Not on file  Transportation Needs:   . Lack of Transportation (Medical): Not on file  . Lack of Transportation (Non-Medical): Not on file  Physical Activity:   . Days of Exercise per Week: Not on file  . Minutes of Exercise per Session: Not on file  Stress:   . Feeling of Stress : Not on file  Social Connections:   . Frequency of Communication with Friends and Family: Not on file  . Frequency of Social Gatherings with Friends and Family: Not on file  . Attends Religious Services: Not on file  . Active Member of Clubs or Organizations: Not on file  . Attends Banker Meetings: Not on file  . Marital Status: Not on file    Review of Systems: Gen: Denies fever, chills, anorexia. Denies fatigue, weakness, weight loss.  CV: Denies chest pain, palpitations, syncope, peripheral edema, and claudication. Resp: Denies dyspnea at rest, cough, wheezing, coughing up blood, and pleurisy. GI: see HPI Derm: Denies rash, itching, dry skin Psych: Denies depression, anxiety, memory loss, confusion. No homicidal or suicidal ideation.  Heme: Denies bruising, bleeding, and enlarged lymph nodes.  Physical Exam: BP (!) 93/57   Pulse (!) 52   Temp (!) 97.5 F (36.4 C) (Oral)   Ht 5\' 9"  (1.753 m)   Wt 123 lb 9.6 oz (56.1 kg)   LMP 01/30/2020   BMI 18.25 kg/m  General:   Alert and oriented. No distress noted. Pleasant and cooperative.  Head:  Normocephalic and atraumatic. Eyes:  Conjuctiva clear without scleral icterus. Mouth:  Oral mucosa pink and moist. Good dentition. No lesions. Abdomen:  +BS, soft, non-tender and non-distended. No rebound or guarding. No HSM or masses noted. Msk:  Symmetrical without gross deformities. Normal posture. Extremities:  Without edema. Neurologic:  Alert and  oriented x4 Psych:  Alert and cooperative. Normal  mood and affect.  ASSESSMENT: Madison Page is a delightful 24 y.o. female presenting today with history of chronic constipation, erosive gastropathy, nausea, and weight loss that is now at a plateau.  Constipation has improved with Amitiza 24 mcg po BID. We will continue this as she does not want to add any additional agents. Could consider Motegrity in future. Scant hematochezia in setting of constipation and straining noted historically. She desires to hold off on colonoscopy at this point and monitor.   Nausea likely due to gastritis/GERD. We will resume Protonix once daily. She has had a thorough evaluation thus far including EGD and CT. May need to trial different PPI in future.  PLAN:  Protonix once daily  Amitiza 24 mcg po BID  Call if no improvement  Consider Motegrity  Monitor for any further hematochezia  Return in 3 months   Gelene Mink, PhD, [facility] Gastroenterology

## 2020-03-12 NOTE — Progress Notes (Signed)
[name] patient 

## 2020-03-23 ENCOUNTER — Ambulatory Visit: Payer: No Typology Code available for payment source | Admitting: Gastroenterology

## 2020-04-03 ENCOUNTER — Other Ambulatory Visit: Payer: Self-pay

## 2020-04-05 MED ORDER — PROMETHAZINE HCL 25 MG PO TABS: 12.5000 mg | ORAL_TABLET | Freq: Three times a day (TID) | ORAL | 3 refills | Status: AC | PRN

## 2020-04-06 ENCOUNTER — Telehealth: Payer: Self-pay | Admitting: Gastroenterology

## 2020-04-06 NOTE — Telephone Encounter (Signed)
Samples of Motegrity are ready for pickup. Left a detailed message for pt. Pt is aware.

## 2020-04-06 NOTE — Telephone Encounter (Signed)
Helmut Muster, can we provide Motegrity samples for patient? Take one tablet daily.

## 2020-06-05 ENCOUNTER — Ambulatory Visit (INDEPENDENT_AMBULATORY_CARE_PROVIDER_SITE_OTHER): Payer: No Typology Code available for payment source | Admitting: Nurse Practitioner

## 2020-06-05 ENCOUNTER — Encounter: Payer: Self-pay | Admitting: Nurse Practitioner

## 2020-06-05 ENCOUNTER — Other Ambulatory Visit: Payer: Self-pay

## 2020-06-05 VITALS — BP 125/85 | HR 94 | Temp 96.9°F | Ht 69.0 in | Wt 120.0 lb

## 2020-06-05 DIAGNOSIS — R634 Abnormal weight loss: Secondary | ICD-10-CM

## 2020-06-05 DIAGNOSIS — K921 Melena: Secondary | ICD-10-CM | POA: Diagnosis not present

## 2020-06-05 DIAGNOSIS — R110 Nausea: Secondary | ICD-10-CM | POA: Diagnosis not present

## 2020-06-05 DIAGNOSIS — K5900 Constipation, unspecified: Secondary | ICD-10-CM

## 2020-06-05 NOTE — Progress Notes (Signed)
Referring Provider: No ref. provider found Primary Care Physician:  Patient, [name] Primary GI:  Dr. Jena Gauss  Chief Complaint  Patient presents with  . Constipation    Motegrity caused dizziness after 1 dose. Taking Benefiber and probiotic now-helping  . Nausea    Approx 4 times/month. Overall month    HPI:   Madison Page is a 24 y.o. female who presents for follow-up.  The patient was last seen in our office 03/06/2020 for constipation and nausea without vomiting.  At her last visit noted history of abdominal pain, nausea, constipation, status post EGD on 03/29/2019 with normal esophagus, erosive gastropathy status post biopsy, duodenal erosions with pathology finding mild reactive gastropathy, negative for H. pylori.  Celiac serologies were negative.  At her last visit she noted she felt improved from prior appointments.  Continue significant fluid intake, scant amount of hematochezia in the past with straining but none further and just wants to monitor for now.  Nausea likely contributed to by gastritis/GERD, Zofran not covered by insurance.  Nausea typically in the morning and improves by end of the day.  With constipation Linzess is not covered by her insurance but Amitiza 24 mcg twice daily with associated nausea.  Overall she was satisfied with Amitiza and wanted to continue.  Weight loss stable since September 2021 and she weighed 123.6 pounds at her last visit.  Recommended continue Protonix daily, Amitiza 24 mcg twice daily, call if no improvement.  Consider Motegrity in the future if there is a need to change, notify us of any further hematochezia, follow-up in 3 months.  Today states doing okay overall. States Amitiza continued causing nausea so she tried Motegrity samples. Motegrity caused some dizziness so she stopped taking it. Is now on probiotic and Benefiber and is having regular bowel movements which are consistent with [place] 4. Nausea has since significantly  improved. Still occurs occasionally, at random, typically if she hasn't eaten. No longer daily nausea. Denies any further hematochezia. Denies abdominal pain, vomiting, melena, fever, chills, unintentional weight loss. Denies URI or flu-like symptoms. Denies loss of sense of taste or smell. Weight remains stable, she feels like it's better (subjectively up to 120 lb from 17 lbs; objectively she is within a couple lbs from her last visit.) The patient has not received COVID-19 vaccination(s). Denies chest pain, dyspnea, dizziness, lightheadedness, syncope, near syncope. Denies any other upper or lower GI symptoms.  She is out of Zofran, but has Phenergan on hand if needed.  Past Medical History:  Diagnosis Date  . Asthma     Past Surgical History:  Procedure Laterality Date  . BIOPSY  03/31/2019   Procedure: BIOPSY;  Surgeon: Corbin Ade, MD; erosive gastropathy biopsied.  Pathology with mild reactive gastropathy, negative H. pylori.  . ESOPHAGOGASTRODUODENOSCOPY (EGD) WITH PROPOFOL N/A 03/31/2019   normal esophagus, erosive gastropathy s/p biopsied, duodenal erosions. Pathology with mild reactive gastropathy, negative for H. Pylori  . TONSILLECTOMY      Current Outpatient Medications  Medication Sig Dispense Refill  . LO LOESTRIN FE 1 MG-10 MCG / 10 MCG tablet Take 1 tablet by mouth daily.    . pantoprazole (PROTONIX) 40 MG tablet Take 1 tablet (40 mg total) by mouth daily. 30 minutes before a meal 30 tablet 3  . Probiotic Product (PROBIOTIC PO) Take by mouth daily.    . promethazine (PHENERGAN) 25 MG tablet Take 0.5 tablets (12.5 mg total) by mouth every 8 (eight) hours as needed for nausea  or vomiting. Monitor for drowsiness. May take whole tablet if needed. 30 tablet 3  . Wheat Dextrin (BENEFIBER) POWD Take by mouth daily. Takes 1 Tbsp daily to every other day     No current facility-administered medications for this visit.    Allergies as of 06/05/2020  . (No Known Allergies)     Family History  Problem Relation Age of Onset  . Colon cancer Neg Hx   . Inflammatory bowel disease Neg Hx     Social History   Socioeconomic History  . Marital status: Single    Spouse name: Not on file  . Number of children: Not on file  . Years of education: Not on file  . Highest education level: Not on file  Occupational History  . Not on file  Tobacco Use  . Smoking status: Current Every Day Smoker    Packs/day: 0.25    Years: 4.00    Pack years: 1.00    Types: E-cigarettes    Last attempt to quit: 10/27/2018    Years since quitting: 1.6  . Smokeless tobacco: Never Used  . Tobacco comment: Vapes  Vaping Use  . Vaping Use: Every day  . Substances: Nicotine, Flavoring  . Devices: airbar  Substance and Sexual Activity  . Alcohol use: Yes    Comment: occas  . Drug use: Yes    Types: Marijuana    Comment: 3-4 times a month  . Sexual activity: Yes    Birth control/protection: Pill  Other Topics Concern  . Not on file  Social History Narrative  . Not on file   Social Determinants of Health   Financial Resource Strain: Not on file  Food Insecurity: Not on file  Transportation Needs: Not on file  Physical Activity: Not on file  Stress: Not on file  Social Connections: Not on file    Subjective: Review of Systems  Constitutional: Negative for chills, fever, malaise/fatigue and weight loss.  HENT: Negative for congestion and sore throat.   Respiratory: Negative for cough and shortness of breath.   Cardiovascular: Negative for chest pain and palpitations.  Gastrointestinal: Negative for abdominal pain, blood in stool, constipation, diarrhea, heartburn, melena, nausea and vomiting.  Musculoskeletal: Negative for joint pain and myalgias.  Skin: Negative for rash.  Neurological: Negative for dizziness and weakness.  Endo/Heme/Allergies: Does not bruise/bleed easily.  Psychiatric/Behavioral: Negative for depression. The patient is not nervous/anxious.   All  other systems reviewed and are negative.    Objective: BP 125/85   Pulse 94   Temp (!) 96.9 F (36.1 C) (Temporal)   Ht 5\' 9"  (1.753 m)   Wt 120 lb (54.4 kg)   LMP 01/30/2020 (Exact Date) Comment: takes oral contraceptive  BMI 17.72 kg/m  Physical Exam Vitals and nursing note reviewed.  Constitutional:      General: She is not in acute distress.    Appearance: Normal appearance. She is well-developed and normal weight. She is not ill-appearing, toxic-appearing or diaphoretic.  HENT:     Head: Normocephalic and atraumatic.     Nose: No congestion or rhinorrhea.  Eyes:     General: No scleral icterus. Cardiovascular:     Rate and Rhythm: Normal rate and regular rhythm.     Heart sounds: Normal heart sounds.  Pulmonary:     Effort: Pulmonary effort is normal. No respiratory distress.     Breath sounds: Normal breath sounds.  Abdominal:     General: Bowel sounds are normal.  Palpations: Abdomen is soft. There is no hepatomegaly, splenomegaly or mass.     Tenderness: There is no abdominal tenderness. There is no guarding or rebound.     Hernia: No hernia is present.  Skin:    General: Skin is warm and dry.     Coloration: Skin is not jaundiced.     Findings: No rash.  Neurological:     General: No focal deficit present.     Mental Status: She is alert and oriented to person, place, and time.  Psychiatric:        Attention and Perception: Attention normal.        Mood and Affect: Mood normal.        Speech: Speech normal.        Behavior: Behavior normal.        Thought Content: Thought content normal.        Cognition and Memory: Cognition and memory normal.      Assessment:  Very pleasant 24 year old female presents for follow-up on nausea/vomiting, constipation, weight loss, hematochezia.  Overall she is significantly improved from a clinical standpoint.  No red flag/warning signs or symptoms today.  Constipation with nausea: The patient was significantly  nauseated on Amitiza 24 mcg twice daily.  She tried Motegrity samples but this caused dizziness.  Since then she has stopped taking these medications and was started on Benefiber and probiotics.  She thinks the Motegrity may have "jumpstarted my system" and she is now having regular, soft bowel movements with no ongoing constipation.  Since changing off of Amitiza and with improvement constipation her nausea is mostly resolved.  She will occasionally, randomly, have some nausea but much improved and not daily.  Weight loss: She is down a couple pounds compared to her last office visit, but overall she feels like she is better.  Subjectively her weight is up from her "bottom weight" of 117 pounds.  She is continuing to try to eat a nutritious diet to promote healthy weight gain.  Overall I feel her weight is stable over the past several months.  Hematochezia: Scant toilet tissue hematochezia is noted at her previous visit in the setting of known constipation and straining.  She elected to monitor at her last visit rather than proceed with colonoscopy.  She does not have any further hematochezia.  She agrees to monitor and let us know if she has anything further.   Plan: 1. Continue probiotics and Benefiber for constipation 2. Phenergan as needed for any breakthrough/severe nausea 3. Continue other medications 4. Monitor for any hematochezia and let us know if this occurs 5. Follow-up in 6 months.    Thank you for allowing [place] to participate in the care of Tangy Kyra Searles, DNP, AGNP-C Adult & Gerontological Nurse Practitioner [facility]   06/05/2020 2:51 PM   Disclaimer: This note was dictated with voice recognition software. Similar sounding words can inadvertently be transcribed and may not be corrected upon review.

## 2020-06-05 NOTE — Patient Instructions (Signed)
Your health issues we discussed today were:   Constipation: 1. I am glad you are doing better! 2. Continue taking probiotics and Benefiber 3. Let us know if you have any worsening or severe constipation  Nausea: 1. I am glad you are doing better! 2. I feel your nausea was likely related to your significant constipation as well as a side effect from Amitiza 3. You can use Phenergan as needed for any "breakthrough nausea" 4. Let us know if your nausea becomes more persistent or severe  Weight loss: 1. Continue to monitor your weight and let us know if you have any worsening or significant weight loss 2. Strive to eat a nutritious diet with adequate proteins, fats, carbohydrates in order to promote healthy weight gain  Blood on the toilet tissue: 1. I feel this is likely due to your constipation previously 2. Continue to monitor and let us know if you see any further bleeding  Overall I recommend:  1. Continue your other current medications 2. Return for follow-up in 6 months 3. Call us for any questions or concerns   ---------------------------------------------------------------  At [facility] we value your feedback. You may receive a survey about your visit today. Please share your experience as we strive to create trusting relationships with our patients to provide genuine, compassionate, quality care.  We appreciate your understanding and patience as we review any laboratory studies, imaging, and other diagnostic tests that are ordered as we care for you. Our office policy is 5 business days for review of these results, and any emergent or urgent results are addressed in a timely manner for your best interest. If you do not hear from our office in 1 week, please contact us.   We also encourage the use of MyChart, which contains your medical information for your review as well. If you are not enrolled in this feature, an access code is on this after visit summary  for your convenience. Thank you for allowing [place] to be involved in your care.  It was great to see you today!  I hope you have a Merry Christmas and Happy Holidays!!    ---------------------------------------------------------------

## 2020-06-06 NOTE — Progress Notes (Signed)
[name] patient 

## 2020-11-21 ENCOUNTER — Other Ambulatory Visit: Payer: Self-pay | Admitting: Gastroenterology

## 2020-12-04 ENCOUNTER — Ambulatory Visit (INDEPENDENT_AMBULATORY_CARE_PROVIDER_SITE_OTHER): Payer: No Typology Code available for payment source | Admitting: Gastroenterology

## 2020-12-04 ENCOUNTER — Encounter: Payer: Self-pay | Admitting: Internal Medicine

## 2020-12-04 ENCOUNTER — Other Ambulatory Visit: Payer: Self-pay

## 2020-12-04 ENCOUNTER — Encounter: Payer: Self-pay | Admitting: Gastroenterology

## 2020-12-04 ENCOUNTER — Telehealth: Payer: Self-pay

## 2020-12-04 VITALS — BP 106/67 | HR 67 | Temp 97.7°F | Ht 69.0 in | Wt 117.2 lb

## 2020-12-04 DIAGNOSIS — R110 Nausea: Secondary | ICD-10-CM

## 2020-12-04 DIAGNOSIS — R6881 Early satiety: Secondary | ICD-10-CM

## 2020-12-04 DIAGNOSIS — R634 Abnormal weight loss: Secondary | ICD-10-CM

## 2020-12-04 NOTE — Progress Notes (Signed)
Referring Provider: No ref. provider found Primary Care Physician:  Benita Stabile, MD Primary GI: Dr. Jena Gauss   Chief Complaint  Patient presents with   Weight Loss   change in appetite    Sometimes smell of food makes nauseated or when she eats feels very heavy when eating    HPI:   Madison Page is a 25 y.o. female presenting today with a history of  abdominal pain, nausea, constipation, s/p EGD on 03/31/2019 with normal esophagus, erosive gastropathy s/p biopsied, duodenal erosions. Pathology with mild reactive gastropathy, negative for H. Pylori. Celiac serologies negative. CBC, iron studies on file from May 2021.   Constipation: Linzess not covered. Amitiza caused nausea. Motegrity caused dizziness. Flaxseed sprinkled on food as needed. No constipation currently.   Nausea: was doing well at last visit. However at beginning of May started having empitness feeling. Eating one or 2 meals a day. Sometimes unable to eat till late at night when gets home. Feels nauseated when not eating. Full off of 2-3 bites of food. Feels like food just sitting heavy on stomach. No vomiting. Keeps citrus green tea and sprite with her. Can eat more when it's late at night. Ate pasta late at night last night and pringles. No dysphagia. Will have reflux about 2-3 times per week. Will take pepto and it's gone. Taking PPI once daily, usually right before dinner. No NSAIDs.   Weight loss:  Weight 140 in Oct 2020, 133 in Jan 2021, and 124 in May 2021. Sept 2021 was 123.6. Dec 2021 was 120.   Past Medical History:  Diagnosis Date   Asthma     Past Surgical History:  Procedure Laterality Date   BIOPSY  03/31/2019   Procedure: BIOPSY;  Surgeon: Corbin Ade, MD; erosive gastropathy biopsied.  Pathology with mild reactive gastropathy, negative H. pylori.   ESOPHAGOGASTRODUODENOSCOPY (EGD) WITH PROPOFOL N/A 03/31/2019   normal esophagus, erosive gastropathy s/p biopsied, duodenal erosions. Pathology with  mild reactive gastropathy, negative for H. Pylori   TONSILLECTOMY      Current Outpatient Medications  Medication Sig Dispense Refill   Flaxseed, Linseed, (FLAX SEEDS PO) Take by mouth. As needed in food     LO LOESTRIN FE 1 MG-10 MCG / 10 MCG tablet Take 1 tablet by mouth daily.     pantoprazole (PROTONIX) 40 MG tablet TAKE 1 TABLET BY MOUTH DAILY 30 MINUTES BEFORE A MEAL 90 tablet 1   Probiotic Product (PROBIOTIC PO) Take by mouth daily. As needed     promethazine (PHENERGAN) 25 MG tablet Take 0.5 tablets (12.5 mg total) by mouth every 8 (eight) hours as needed for nausea or vomiting. Monitor for drowsiness. May take whole tablet if needed. 30 tablet 3   Wheat Dextrin (BENEFIBER) POWD Take by mouth daily. Takes 1 Tbsp daily to every other day (Patient not taking: Reported on 12/04/2020)     No current facility-administered medications for this visit.    Allergies as of 12/04/2020   (No Known Allergies)    Family History  Problem Relation Age of Onset   Colon cancer Neg Hx    Inflammatory bowel disease Neg Hx     Social History   Socioeconomic History   Marital status: Single    Spouse name: Not on file   Number of children: Not on file   Years of education: Not on file   Highest education level: Not on file  Occupational History   Not on file  Tobacco Use  Smoking status: Every Day    Packs/day: 0.25    Years: 4.00    Pack years: 1.00    Types: E-cigarettes, Cigarettes    Last attempt to quit: 10/27/2018    Years since quitting: 2.1   Smokeless tobacco: Never   Tobacco comments:    Vapes  Vaping Use   Vaping Use: Every day   Substances: Nicotine, Flavoring   Devices: airbar  Substance and Sexual Activity   Alcohol use: Yes    Comment: occas   Drug use: Yes    Types: Marijuana    Comment: 3-4 times a month   Sexual activity: Yes    Birth control/protection: Pill  Other Topics Concern   Not on file  Social History Narrative   Not on file   Social  Determinants of Health   Financial Resource Strain: Not on file  Food Insecurity: Not on file  Transportation Needs: Not on file  Physical Activity: Not on file  Stress: Not on file  Social Connections: Not on file    Review of Systems: Gen: Denies fever, chills, anorexia. Denies fatigue, weakness, weight loss.  CV: Denies chest pain, palpitations, syncope, peripheral edema, and claudication. Resp: Denies dyspnea at rest, cough, wheezing, coughing up blood, and pleurisy. GI: see HPI Derm: Denies rash, itching, dry skin Psych: Denies depression, anxiety, memory loss, confusion. No homicidal or suicidal ideation.  Heme: Denies bruising, bleeding, and enlarged lymph nodes.  Physical Exam: BP 106/67   Pulse 67   Temp 97.7 F (36.5 C)   Ht 5\' 9"  (1.753 m)   Wt 117 lb 3.2 oz (53.2 kg)   BMI 17.31 kg/m  General:   Alert and oriented. No distress noted. Pleasant and cooperative.  Head:  Normocephalic and atraumatic. Eyes:  Conjuctiva clear without scleral icterus. Mouth:  mask in place Abdomen:  +BS, soft, non-tender and non-distended. No rebound or guarding. No HSM or masses noted. Msk:  Symmetrical without gross deformities. Normal posture. Extremities:  Without edema. Neurologic:  Alert and  oriented x4 Psych:  Alert and cooperative. Normal mood and affect.  ASSESSMENT: Madison Page is a 25 y.o. female presenting today in follow-up for nausea, early satiety, weight loss, and constipation. EGD on 03/31/2019 with normal esophagus, erosive gastropathy s/p biopsied, duodenal erosions. Pathology with mild reactive gastropathy, negative for H. Pylori. Celiac serologies negative. CBC, iron studies on file from May 2021.    Nausea: had been doing well since last appt but now for past few months has noted early satiety, "heavy" feeling of food, no vomiting, worsened nausea when not eating but only able to eat small amounts. PPI daily. Will hold off on early interval EGD and pursue  GES. May need EGD again. I have asked that she change PPI to morning .  Constipation: doing well with flaxseed. Resolved.  Weight loss: slow, steady weight loss in setting of decreased oral intake. Denies any emotional or psychological distress and actually notes she has been enjoying her job. Mother with history of anorexia. I don't feel we are dealing with a psychiatric component at this time and need to rule out other physiologic causes.   PLAN:  GES in near future Labs today: CBC, CMP, TSH, fasting cortisol level May need repeat EGD If persistent weight loss and above negative, pursue CT   June 2021, PhD, ANP-BC [facility]

## 2020-12-04 NOTE — Telephone Encounter (Signed)
[facility], spoke to [place], no PA needed for GES. Ref# [id number].

## 2020-12-04 NOTE — Patient Instructions (Signed)
Please have blood work done tomorrow morning (recommend getting to the lab as soon as

## 2020-12-07 LAB — CBC WITH DIFFERENTIAL/PLATELET
Absolute Monocytes: 297 cells/uL (ref 200–950)
Basophils Absolute: 28 cells/uL (ref 0–200)
Basophils Relative: 0.5 %
Eosinophils Absolute: 50 cells/uL (ref 15–500)
Eosinophils Relative: 0.9 %
HCT: 41 % (ref 35.0–45.0)
Hemoglobin: 13.9 g/dL (ref 11.7–15.5)
Lymphs Abs: 1590 cells/uL (ref 850–3900)
MCH: 31.4 pg (ref 27.0–33.0)
MCHC: 33.9 g/dL (ref 32.0–36.0)
MCV: 92.6 fL (ref 80.0–100.0)
MPV: 10.7 fL (ref 7.5–12.5)
Monocytes Relative: 5.4 %
Neutro Abs: 3537 cells/uL (ref 1500–7800)
Neutrophils Relative %: 64.3 %
Platelets: 278 10*3/uL (ref 140–400)
RBC: 4.43 10*6/uL (ref 3.80–5.10)
RDW: 11.4 % (ref 11.0–15.0)
Total Lymphocyte: 28.9 %
WBC: 5.5 10*3/uL (ref 3.8–10.8)

## 2020-12-07 LAB — COMPLETE METABOLIC PANEL WITH GFR
AG Ratio: 1.8 (calc) (ref 1.0–2.5)
ALT: 17 U/L (ref 6–29)
AST: 14 U/L (ref 10–30)
Albumin: 4.4 g/dL (ref 3.6–5.1)
Alkaline phosphatase (APISO): 23 U/L — ABNORMAL LOW (ref 31–125)
BUN: 17 mg/dL (ref 7–25)
CO2: 25 mmol/L (ref 20–32)
Calcium: 9.1 mg/dL (ref 8.6–10.2)
Chloride: 107 mmol/L (ref 98–110)
Creat: 0.73 mg/dL (ref 0.50–1.10)
GFR, Est African American: 134 mL/min/{1.73_m2} (ref >=60)
GFR, Est Non African American: 115 mL/min/{1.73_m2} (ref >=60)
Globulin: 2.4 g/dL (calc) (ref 1.9–3.7)
Glucose, Bld: 105 mg/dL (ref 65–139)
Potassium: 3.8 mmol/L (ref 3.5–5.3)
Sodium: 140 mmol/L (ref 135–146)
Total Bilirubin: 0.8 mg/dL (ref 0.2–1.2)
Total Protein: 6.8 g/dL (ref 6.1–8.1)

## 2020-12-07 LAB — TSH: TSH: 0.59 mIU/L

## 2020-12-07 LAB — CORTISOL: Cortisol, Plasma: 13.9 ug/dL

## 2020-12-11 ENCOUNTER — Other Ambulatory Visit: Payer: Self-pay

## 2020-12-11 ENCOUNTER — Encounter (HOSPITAL_COMMUNITY): Payer: Self-pay

## 2020-12-11 ENCOUNTER — Ambulatory Visit (HOSPITAL_COMMUNITY)
Admission: RE | Admit: 2020-12-11 | Discharge: 2020-12-11 | Disposition: A | Discharge status: Home / Self Care | Payer: No Typology Code available for payment source | Source: Ambulatory Visit | Attending: Gastroenterology | Admitting: Gastroenterology

## 2020-12-11 DIAGNOSIS — R6881 Early satiety: Secondary | ICD-10-CM | POA: Insufficient documentation

## 2020-12-11 DIAGNOSIS — R634 Abnormal weight loss: Secondary | ICD-10-CM | POA: Diagnosis present

## 2020-12-11 DIAGNOSIS — R110 Nausea: Secondary | ICD-10-CM | POA: Insufficient documentation

## 2020-12-11 MED ORDER — TECHNETIUM TC 99M SULFUR COLLOID
2.0000 | Freq: Once | INTRAVENOUS | Status: AC | PRN
  Administered 2020-12-11: 2.2 via ORAL

## 2021-04-02 ENCOUNTER — Ambulatory Visit: Payer: No Typology Code available for payment source | Admitting: Gastroenterology

## 2023-04-17 DIAGNOSIS — Z419 Encounter for procedure for purposes other than remedying health state, unspecified: Secondary | ICD-10-CM | POA: Diagnosis not present

## 2023-04-20 DIAGNOSIS — N911 Secondary amenorrhea: Secondary | ICD-10-CM | POA: Diagnosis not present

## 2023-04-24 DIAGNOSIS — Z3685 Encounter for antenatal screening for Streptococcus B: Secondary | ICD-10-CM | POA: Diagnosis not present

## 2023-04-24 DIAGNOSIS — Z3401 Encounter for supervision of normal first pregnancy, first trimester: Secondary | ICD-10-CM | POA: Diagnosis not present

## 2023-04-24 DIAGNOSIS — Z31430 Encounter of female for testing for genetic disease carrier status for procreative management: Secondary | ICD-10-CM | POA: Diagnosis not present

## 2023-04-24 DIAGNOSIS — Z3A09 9 weeks gestation of pregnancy: Secondary | ICD-10-CM | POA: Diagnosis not present

## 2023-04-24 DIAGNOSIS — Z3481 Encounter for supervision of other normal pregnancy, first trimester: Secondary | ICD-10-CM | POA: Diagnosis not present

## 2023-04-24 LAB — OB RESULTS CONSOLE RUBELLA ANTIBODY, IGM: Rubella: IMMUNE

## 2023-04-24 LAB — OB RESULTS CONSOLE HIV ANTIBODY (ROUTINE TESTING): HIV: NONREACTIVE

## 2023-04-24 LAB — OB RESULTS CONSOLE ANTIBODY SCREEN: Antibody Screen: NEGATIVE

## 2023-04-24 LAB — OB RESULTS CONSOLE RPR: RPR: NONREACTIVE

## 2023-04-24 LAB — OB RESULTS CONSOLE HEPATITIS B SURFACE ANTIGEN: Hepatitis B Surface Ag: NEGATIVE

## 2023-04-24 LAB — HEPATITIS C ANTIBODY: HCV Ab: NEGATIVE

## 2023-05-04 DIAGNOSIS — O219 Vomiting of pregnancy, unspecified: Secondary | ICD-10-CM | POA: Diagnosis not present

## 2023-05-04 DIAGNOSIS — Z3A10 10 weeks gestation of pregnancy: Secondary | ICD-10-CM | POA: Diagnosis not present

## 2023-05-04 DIAGNOSIS — Z3481 Encounter for supervision of other normal pregnancy, first trimester: Secondary | ICD-10-CM | POA: Diagnosis not present

## 2023-05-04 DIAGNOSIS — Z113 Encounter for screening for infections with a predominantly sexual mode of transmission: Secondary | ICD-10-CM | POA: Diagnosis not present

## 2023-05-12 DIAGNOSIS — J00 Acute nasopharyngitis [common cold]: Secondary | ICD-10-CM | POA: Diagnosis not present

## 2023-05-12 DIAGNOSIS — R0981 Nasal congestion: Secondary | ICD-10-CM | POA: Diagnosis not present

## 2023-05-15 DIAGNOSIS — J019 Acute sinusitis, unspecified: Secondary | ICD-10-CM | POA: Diagnosis not present

## 2023-05-17 DIAGNOSIS — Z419 Encounter for procedure for purposes other than remedying health state, unspecified: Secondary | ICD-10-CM | POA: Diagnosis not present

## 2023-06-08 DIAGNOSIS — Z361 Encounter for antenatal screening for raised alphafetoprotein level: Secondary | ICD-10-CM | POA: Diagnosis not present

## 2023-06-11 DIAGNOSIS — J101 Influenza due to other identified influenza virus with other respiratory manifestations: Secondary | ICD-10-CM | POA: Diagnosis not present

## 2023-06-11 DIAGNOSIS — U071 COVID-19: Secondary | ICD-10-CM | POA: Diagnosis not present

## 2023-06-11 DIAGNOSIS — J019 Acute sinusitis, unspecified: Secondary | ICD-10-CM | POA: Diagnosis not present

## 2023-06-17 DIAGNOSIS — Z419 Encounter for procedure for purposes other than remedying health state, unspecified: Secondary | ICD-10-CM | POA: Diagnosis not present

## 2023-06-17 NOTE — L&D Delivery Note (Signed)
 Delivery Note At 1:13 PM a viable female was delivered via  (Presentation: OA).  APGAR: 9; 9 weight 3820g.   Placenta status: Spontaneous, Intact.  Cord: 3 vessels with the following complications: none.  Anesthesia: Epidural Episiotomy: N/A Lacerations: 2nd degree Suture Repair: 2.0 vicryl rapide Est. Blood Loss (mL): 217ccs  Repair of second degree laceration was completed for hemostasis. I then turned over delivery of placenta to Dr. Sari Cunning (please see his note) in order to present to the operating room for a cesarean section. Placenta delivery was uncomplicated.  Mom to postpartum.  Baby to Couplet care / Skin to Skin.  Grace Laura 12/04/2023, 1:57 PM

## 2023-07-10 DIAGNOSIS — Z3A20 20 weeks gestation of pregnancy: Secondary | ICD-10-CM | POA: Diagnosis not present

## 2023-07-10 DIAGNOSIS — Z3402 Encounter for supervision of normal first pregnancy, second trimester: Secondary | ICD-10-CM | POA: Diagnosis not present

## 2023-07-10 DIAGNOSIS — Z363 Encounter for antenatal screening for malformations: Secondary | ICD-10-CM | POA: Diagnosis not present

## 2023-07-18 DIAGNOSIS — Z419 Encounter for procedure for purposes other than remedying health state, unspecified: Secondary | ICD-10-CM | POA: Diagnosis not present

## 2023-07-30 DIAGNOSIS — H5213 Myopia, bilateral: Secondary | ICD-10-CM | POA: Diagnosis not present

## 2023-08-15 DIAGNOSIS — Z419 Encounter for procedure for purposes other than remedying health state, unspecified: Secondary | ICD-10-CM | POA: Diagnosis not present

## 2023-09-03 DIAGNOSIS — Z3480 Encounter for supervision of other normal pregnancy, unspecified trimester: Secondary | ICD-10-CM | POA: Diagnosis not present

## 2023-09-07 DIAGNOSIS — O360130 Maternal care for anti-D [Rh] antibodies, third trimester, not applicable or unspecified: Secondary | ICD-10-CM | POA: Diagnosis not present

## 2023-09-07 DIAGNOSIS — Z3A28 28 weeks gestation of pregnancy: Secondary | ICD-10-CM | POA: Diagnosis not present

## 2023-09-09 DIAGNOSIS — O99810 Abnormal glucose complicating pregnancy: Secondary | ICD-10-CM | POA: Diagnosis not present

## 2023-09-26 DIAGNOSIS — Z419 Encounter for procedure for purposes other than remedying health state, unspecified: Secondary | ICD-10-CM | POA: Diagnosis not present

## 2023-10-26 DIAGNOSIS — Z3483 Encounter for supervision of other normal pregnancy, third trimester: Secondary | ICD-10-CM | POA: Diagnosis not present

## 2023-10-26 DIAGNOSIS — Z3482 Encounter for supervision of other normal pregnancy, second trimester: Secondary | ICD-10-CM | POA: Diagnosis not present

## 2023-10-26 DIAGNOSIS — Z419 Encounter for procedure for purposes other than remedying health state, unspecified: Secondary | ICD-10-CM | POA: Diagnosis not present

## 2023-11-06 DIAGNOSIS — Z3685 Encounter for antenatal screening for Streptococcus B: Secondary | ICD-10-CM | POA: Diagnosis not present

## 2023-11-06 LAB — OB RESULTS CONSOLE GBS: GBS: NEGATIVE

## 2023-11-26 DIAGNOSIS — Z419 Encounter for procedure for purposes other than remedying health state, unspecified: Secondary | ICD-10-CM | POA: Diagnosis not present

## 2023-12-01 DIAGNOSIS — Z3400 Encounter for supervision of normal first pregnancy, unspecified trimester: Secondary | ICD-10-CM | POA: Diagnosis not present

## 2023-12-01 DIAGNOSIS — O480 Post-term pregnancy: Secondary | ICD-10-CM | POA: Diagnosis not present

## 2023-12-01 DIAGNOSIS — Z3A40 40 weeks gestation of pregnancy: Secondary | ICD-10-CM | POA: Diagnosis not present

## 2023-12-03 ENCOUNTER — Encounter (HOSPITAL_COMMUNITY): Payer: Self-pay | Admitting: *Deleted

## 2023-12-03 ENCOUNTER — Telehealth (HOSPITAL_COMMUNITY): Payer: Self-pay | Admitting: *Deleted

## 2023-12-03 NOTE — Telephone Encounter (Signed)
 Preadmission screen

## 2023-12-04 ENCOUNTER — Inpatient Hospital Stay (HOSPITAL_COMMUNITY)
Admission: AD | Admit: 2023-12-04 | Discharge: 2023-12-06 | DRG: 788 | Disposition: A | Discharge status: Home / Self Care | Attending: Obstetrics and Gynecology | Admitting: Obstetrics and Gynecology

## 2023-12-04 ENCOUNTER — Inpatient Hospital Stay (HOSPITAL_COMMUNITY): Admitting: Anesthesiology

## 2023-12-04 ENCOUNTER — Encounter (HOSPITAL_COMMUNITY): Payer: Self-pay

## 2023-12-04 ENCOUNTER — Other Ambulatory Visit: Payer: Self-pay

## 2023-12-04 ENCOUNTER — Encounter (HOSPITAL_COMMUNITY): Payer: Self-pay | Admitting: Obstetrics and Gynecology

## 2023-12-04 DIAGNOSIS — Z3A41 41 weeks gestation of pregnancy: Secondary | ICD-10-CM | POA: Diagnosis not present

## 2023-12-04 DIAGNOSIS — O26893 Other specified pregnancy related conditions, third trimester: Secondary | ICD-10-CM | POA: Diagnosis present

## 2023-12-04 DIAGNOSIS — O480 Post-term pregnancy: Secondary | ICD-10-CM | POA: Diagnosis present

## 2023-12-04 DIAGNOSIS — Z87891 Personal history of nicotine dependence: Secondary | ICD-10-CM

## 2023-12-04 DIAGNOSIS — O479 False labor, unspecified: Principal | ICD-10-CM

## 2023-12-04 DIAGNOSIS — Z6791 Unspecified blood type, Rh negative: Secondary | ICD-10-CM

## 2023-12-04 LAB — CBC
HCT: 39.6 % (ref 36.0–46.0)
Hemoglobin: 13.1 g/dL (ref 12.0–15.0)
MCH: 30.9 pg (ref 26.0–34.0)
MCHC: 33.1 g/dL (ref 30.0–36.0)
MCV: 93.4 fL (ref 80.0–100.0)
Platelets: 215 10*3/uL (ref 150–400)
RBC: 4.24 MIL/uL (ref 3.87–5.11)
RDW: 12.9 % (ref 11.5–15.5)
WBC: 17.7 10*3/uL — ABNORMAL HIGH (ref 4.0–10.5)
nRBC: 0 % (ref 0.0–0.2)

## 2023-12-04 LAB — TYPE AND SCREEN
ABO/RH(D): AB NEG
Antibody Screen: NEGATIVE

## 2023-12-04 LAB — RPR
RPR Ser Ql: NONREACTIVE
RPR Ser Ql: NONREACTIVE

## 2023-12-04 MED ORDER — ACETAMINOPHEN 325 MG PO TABS: 650.0000 mg | ORAL_TABLET | ORAL | Status: DC | PRN

## 2023-12-04 MED ORDER — EPHEDRINE 5 MG/ML INJ: 10.0000 mg | INTRAVENOUS | Status: DC | PRN

## 2023-12-04 MED ORDER — SOD CITRATE-CITRIC ACID 500-334 MG/5ML PO SOLN: 30.0000 mL | ORAL | Status: DC | PRN

## 2023-12-04 MED ORDER — PHENYLEPHRINE 80 MCG/ML (10ML) SYRINGE FOR IV PUSH (FOR BLOOD PRESSURE SUPPORT): 80.0000 ug | PREFILLED_SYRINGE | INTRAVENOUS | Status: DC | PRN

## 2023-12-04 MED ORDER — LACTATED RINGERS IV SOLN: 500.0000 mL | Freq: Once | INTRAVENOUS | Status: DC

## 2023-12-04 MED ORDER — ONDANSETRON HCL 4 MG PO TABS
4.0000 mg | ORAL_TABLET | ORAL | Status: AC | PRN
Start: 2023-12-04 — End: ?

## 2023-12-04 MED ORDER — ZOLPIDEM TARTRATE 5 MG PO TABS: 5.0000 mg | ORAL_TABLET | Freq: Every evening | ORAL | Status: DC | PRN

## 2023-12-04 MED ORDER — ONDANSETRON HCL 4 MG/2ML IJ SOLN
4.0000 mg | Freq: Four times a day (QID) | INTRAMUSCULAR | Status: DC | PRN
  Administered 2023-12-04: 4 mg via INTRAVENOUS
  Filled 2023-12-04: qty 2

## 2023-12-04 MED ORDER — BENZOCAINE-MENTHOL 20-0.5 % EX AERO
1.0000 | INHALATION_SPRAY | CUTANEOUS | Status: DC | PRN
  Filled 2023-12-04: qty 56
  Filled 2023-12-04: qty 112

## 2023-12-04 MED ORDER — OXYCODONE-ACETAMINOPHEN 5-325 MG PO TABS: 2.0000 | ORAL_TABLET | ORAL | Status: DC | PRN

## 2023-12-04 MED ORDER — IBUPROFEN 600 MG PO TABS
600.0000 mg | ORAL_TABLET | Freq: Four times a day (QID) | ORAL | Status: DC
  Administered 2023-12-04 – 2023-12-06 (×8): 600 mg via ORAL
  Filled 2023-12-04 (×8): qty 1

## 2023-12-04 MED ORDER — DIPHENHYDRAMINE HCL 50 MG/ML IJ SOLN: 12.5000 mg | INTRAMUSCULAR | Status: DC | PRN

## 2023-12-04 MED ORDER — DIPHENHYDRAMINE HCL 25 MG PO CAPS: 25.0000 mg | ORAL_CAPSULE | Freq: Four times a day (QID) | ORAL | Status: DC | PRN

## 2023-12-04 MED ORDER — FLEET ENEMA RE ENEM: 1.0000 | ENEMA | RECTAL | Status: DC | PRN

## 2023-12-04 MED ORDER — OXYCODONE-ACETAMINOPHEN 5-325 MG PO TABS: 1.0000 | ORAL_TABLET | ORAL | Status: DC | PRN

## 2023-12-04 MED ORDER — OXYTOCIN-SODIUM CHLORIDE 30-0.9 UT/500ML-% IV SOLN
2.5000 [IU]/h | INTRAVENOUS | Status: DC
  Filled 2023-12-04: qty 500

## 2023-12-04 MED ORDER — ACETAMINOPHEN 325 MG PO TABS
650.0000 mg | ORAL_TABLET | ORAL | Status: DC | PRN
  Administered 2023-12-05 – 2023-12-06 (×4): 650 mg via ORAL
  Filled 2023-12-04 (×5): qty 2

## 2023-12-04 MED ORDER — OXYTOCIN BOLUS FROM INFUSION
333.0000 mL | Freq: Once | INTRAVENOUS | Status: AC
  Administered 2023-12-04: 333 mL via INTRAVENOUS

## 2023-12-04 MED ORDER — LIDOCAINE HCL (PF) 1 % IJ SOLN: 30.0000 mL | INTRAMUSCULAR | Status: DC | PRN

## 2023-12-04 MED ORDER — COCONUT OIL OIL
1.0000 | TOPICAL_OIL | Status: DC | PRN
  Administered 2023-12-05: 1 via TOPICAL

## 2023-12-04 MED ORDER — LACTATED RINGERS IV SOLN: INTRAVENOUS | Status: DC

## 2023-12-04 MED ORDER — SIMETHICONE 80 MG PO CHEW: 80.0000 mg | CHEWABLE_TABLET | ORAL | Status: DC | PRN

## 2023-12-04 MED ORDER — FENTANYL-BUPIVACAINE-NACL 0.5-0.125-0.9 MG/250ML-% EP SOLN
12.0000 mL/h | EPIDURAL | Status: DC | PRN
  Administered 2023-12-04: 12 mL/h via EPIDURAL
  Filled 2023-12-04: qty 250

## 2023-12-04 MED ORDER — PRENATAL MULTIVITAMIN CH
1.0000 | ORAL_TABLET | Freq: Every day | ORAL | Status: DC
Start: 2023-12-05 — End: 2023-12-06
  Administered 2023-12-05 – 2023-12-06 (×2): 1 via ORAL
  Filled 2023-12-04 (×2): qty 1

## 2023-12-04 MED ORDER — LIDOCAINE HCL (PF) 1 % IJ SOLN
INTRAMUSCULAR | Status: DC | PRN
  Administered 2023-12-04: 8 mL via EPIDURAL

## 2023-12-04 MED ORDER — SENNOSIDES-DOCUSATE SODIUM 8.6-50 MG PO TABS
2.0000 | ORAL_TABLET | Freq: Every day | ORAL | Status: DC
  Administered 2023-12-05 – 2023-12-06 (×2): 2 via ORAL
  Filled 2023-12-04 (×2): qty 2

## 2023-12-04 MED ORDER — TETANUS-DIPHTH-ACELL PERTUSSIS 5-2.5-18.5 LF-MCG/0.5 IM SUSY: 0.5000 mL | PREFILLED_SYRINGE | Freq: Once | INTRAMUSCULAR | Status: DC

## 2023-12-04 MED ORDER — WITCH HAZEL-GLYCERIN EX PADS
1.0000 | MEDICATED_PAD | CUTANEOUS | Status: DC | PRN
  Administered 2023-12-05: 1 via TOPICAL

## 2023-12-04 MED ORDER — DIBUCAINE (PERIANAL) 1 % EX OINT: 1.0000 | TOPICAL_OINTMENT | CUTANEOUS | Status: DC | PRN

## 2023-12-04 MED ORDER — FENTANYL-BUPIVACAINE-NACL 0.5-0.125-0.9 MG/250ML-% EP SOLN: 12.0000 mL/h | EPIDURAL | Status: DC | PRN

## 2023-12-04 MED ORDER — ONDANSETRON HCL 4 MG/2ML IJ SOLN: 4.0000 mg | INTRAMUSCULAR | Status: DC | PRN

## 2023-12-04 MED ORDER — OXYCODONE HCL 5 MG PO TABS
5.0000 mg | ORAL_TABLET | ORAL | Status: DC | PRN
  Administered 2023-12-06: 5 mg via ORAL
  Filled 2023-12-04: qty 1

## 2023-12-04 MED ORDER — LACTATED RINGERS IV SOLN: 500.0000 mL | INTRAVENOUS | Status: DC | PRN

## 2023-12-04 NOTE — MAU Provider Note (Cosign Needed)
 Ms. Kerri Kovacik is a G1P0 at [redacted]w[redacted]d seen in MAU for labor. RN labor check, not seen by provider.   SVE by RN Dilation: 2 Effacement (%): 70 Cervical Position: Middle Station: -2 Presentation: Vertex Exam by:: Arlester Bence, RN   NST - FHR: 135 bpm / moderate variability / accels present / decels absent NST reactive / TOCO: regular every 2-4 mins   Plan:  D/C home with labor precautions Keep scheduled appt with [facility] Pt offered to stay at 41 weeks but pt preferred d/c home and IOL scheduled next week  Arlester Bence, CNM  12/04/2023 6:43 AM

## 2023-12-04 NOTE — Anesthesia Procedure Notes (Signed)
 Epidural Patient location during procedure: OB Start time: 12/04/2023 9:06 AM End time: 12/04/2023 9:09 AM  Staffing Anesthesiologist: Jonne Netters, MD  Preanesthetic Checklist Completed: patient identified, IV checked, site marked, risks and benefits discussed, surgical consent, monitors and equipment checked, pre-op evaluation and timeout performed  Epidural Patient position: sitting Prep: DuraPrep and site prepped and draped Patient monitoring: continuous pulse ox and blood pressure Approach: midline Location: L4-L5 Injection technique: LOR air  Needle:  Needle type: Tuohy  Needle gauge: 17 G Needle length: 9 cm and 9 Needle insertion depth: 5 cm Catheter type: closed end flexible Catheter size: 19 Gauge Catheter at skin depth: 11 cm Test dose: negative  Assessment Events: blood not aspirated, no cerebrospinal fluid, injection not painful, no injection resistance, no paresthesia and negative IV test

## 2023-12-04 NOTE — Discharge Instructions (Signed)
 The [employer] ([email or web address])  This circuit takes at least 90 minutes to complete so clear your schedule and make mental preparations so you can relax in your environment.  The second step requires a lot of pillows so gather them up before beginning.  Before starting, you should empty your bladder! Have a nice drink nearby, and make sure it has a straw! If you are having contractions, this circuit should be done through contractions, try not to change positions between steps.  Step One: Open Knee Chest  Stay in this position for 30 minutes, start in cat/cow (on your hands and knees), then drop your chest as low as you can to the bed or the floor and your bottom as high as you can. Knees should be fairly wide apart, and the angle between the torso/thighs should be wider than 90 degrees. Wiggle around, prop with lots of pillows and use this time to get totally relaxed. This position allows the baby to scoot out of the pelvis a bit and gives them room to rotate, shift his/her head position, etc. If it is helpful, a support person can provide careful positioning with a rebozo/sheet under the belly, to help maintain this position for the full 30 minutes.   Step [id number] Side Lying  Roll to your left side, bringing your top leg as high as possible and keeping your bottom leg straight. Roll forward as much as possible, again using a lot of pillows. Sink into the bed and relax some more. If you fall asleep, that's totally okay and you can stay there! If not, stay here for at least another half an hour. Try and get your top right leg up towards your head and get as rolled over onto your belly as much as possible. If you repeat the circuit during labor, try alternating left and right sides.   Step Three: Moving and Lunges  Lunge, walk stairs facing sideways, 2 at a time, (have a spotter downstairs of you!), take a walk outside with one foot on the curb and  the other on the street, sit on a birth ball and hula- anything that's upright and putting your pelvis in open, asymmetrical positions. Spend at least 30 minutes doing this one as well to give your baby a chance to move down. If you are lunging or stair or curb walking, you should lunge/walk/go up stairs in the direction that feels better to you. The key with the lunge is to turn the toes out, at a right angle to your belly button.  Do not lunge with toes pointed forward, over your knee, as that closes the pelvis.  Have fun, and relax!

## 2023-12-04 NOTE — Lactation Note (Signed)
 This note was copied from a baby's chart. Lactation Consultation Note  Patient Name: Madison Page [id number] Date: 12/04/2023 Age:27 hours Reason for consult: Initial assessment;Primapara;1st time breastfeeding;Term;Breastfeeding assistance  P1- MOB reports that infant has been feeding well so far, but he just had an emesis episode of clear thick fluid. LC reviewed how this is normal due to the amniotic fluid still being in his belly. LC reviewed different ways to help infant clear it if needed. It had been 2 hrs since infant's last feeding, so LC offered to assist with a latch if infant was interested. MOB agreed. LC assisted with placing infant on the right breast in the football hold. We attempted to latch infant for 10 minutes with no success. LC reassured MOB that this was okay and encouraged her to try again in an hour. LC provided MOB with a manual pump as requested by MOB.  LC reviewed the first 24 hr birthday nap, day 2 cluster feeding, feeding infant on cue 8-12x in 24 hrs, not allowing infant to go over 3 hrs without a feeding, CDC milk storage guidelines, LC services handout and engorgement/breast care. LC encouraged MOB to call for further assistance as needed.  Maternal Data Has patient been taught Hand Expression?: Yes Does the patient have breastfeeding experience prior to this delivery?: No  Feeding Mother's Current Feeding Choice: Breast Milk  LATCH Score Latch: Too sleepy or reluctant, no latch achieved, no sucking elicited.  Audible Swallowing: None  Type of Nipple: Everted at rest and after stimulation  Comfort (Breast/Nipple): Soft / non-tender  Hold (Positioning): Assistance needed to correctly position infant at breast and maintain latch.  LATCH Score: 5   Lactation Tools Discussed/Used Tools: Pump;Flanges Flange Size: 18 Breast pump type: Manual Pump Education: Setup, frequency, and cleaning;Milk Storage Reason for Pumping: MOB request Pumping  frequency: 15-20 min every 3 hrs  Interventions Interventions: Breast feeding basics reviewed;Assisted with latch;Breast compression;Adjust position;Support pillows;Position options;Hand pump;Education;LC Services brochure  Discharge Discharge Education: Engorgement and breast care;Warning signs for feeding baby Pump: DEBP;Personal  Consult Status Consult Status: Follow-up Date: 12/05/23 Follow-up type: In-patient    Vernette Goo BS, IBCLC 12/04/2023, 9:37 PM

## 2023-12-04 NOTE — Progress Notes (Signed)
 Called to room to assist with delivery. Infant delivered and repair performed, primary MD has a case in the OR and has requested my assistance delivering the placenta. Cord blood obtained, pitocin running, placenta delivered easily, vagina inspected with repaired hemostatic perineal laceration and mild periurethral lacs that are hemostatic and do not require repair, bleeding is minimal.

## 2023-12-04 NOTE — Anesthesia Preprocedure Evaluation (Addendum)
 Anesthesia Evaluation  Patient identified by MRN, date of birth, ID band Patient awake    Reviewed: Allergy & Precautions, H&P , NPO status , Patient's Chart, lab work & pertinent test results  Airway Mallampati: I  TM Distance: >3 FB Neck ROM: Full    Dental no notable dental hx. (+) Teeth Intact, Dental Advisory Given   Pulmonary asthma , former smoker   Pulmonary exam normal breath sounds clear to auscultation       Cardiovascular negative cardio ROS Normal cardiovascular exam Rhythm:Regular Rate:Normal     Neuro/Psych negative neurological ROS  negative psych ROS   GI/Hepatic negative GI ROS, Neg liver ROS,,,  Endo/Other  negative endocrine ROS    Renal/GU negative Renal ROS  negative genitourinary   Musculoskeletal negative musculoskeletal ROS (+)    Abdominal   Peds negative pediatric ROS (+)  Hematology negative hematology ROS (+)   Anesthesia Other Findings   Reproductive/Obstetrics negative OB ROS                             Anesthesia Physical Anesthesia Plan  ASA: 2  Anesthesia Plan: Epidural   Post-op Pain Management: Minimal or no pain anticipated   Induction: Intravenous  PONV Risk Score and Plan: 2 and Treatment may vary due to age or medical condition  Airway Management Planned: Natural Airway  Additional Equipment: Fetal Monitoring  Intra-op Plan:   Post-operative Plan:   Informed Consent: I have reviewed the patients History and Physical, chart, labs and discussed the procedure including the risks, benefits and alternatives for the proposed anesthesia with the patient or authorized representative who has indicated his/her understanding and acceptance.     Dental Advisory Given  Plan Discussed with: Anesthesiologist  Anesthesia Plan Comments: (Labs checked- platelets confirmed with RN in room. Fetal heart tracing, per RN, reported to be stable enough  for sitting procedure. Discussed epidural, and patient consents to the procedure:  included risk of possible headache,backache, failed block, allergic reaction, and nerve injury. This patient was asked if she had any questions or concerns before the procedure started.)       Anesthesia Quick Evaluation

## 2023-12-04 NOTE — H&P (Signed)
 Madison Page is a 28 y.o. female presenting for labor eval in MAU, found to have had a few late decelerations - admitted for augmentation of labor for cat 2 tracing. Pregnancy has otherwise been uncomplicated. Has hx of exercise induced asthma, no medication use in years. Rh neg, will be for rhogam (cfDNA with fetal rh positive).  OB History     Gravida  1   Para      Term      Preterm      AB      Living         SAB      IAB      Ectopic      Multiple      Live Births             Past Medical History:  Diagnosis Date   Asthma    Past Surgical History:  Procedure Laterality Date   BIOPSY  03/31/2019   Procedure: BIOPSY;  Surgeon: Suzette Espy, MD; erosive gastropathy biopsied.  Pathology with mild reactive gastropathy, negative H. pylori.   ESOPHAGOGASTRODUODENOSCOPY (EGD) WITH PROPOFOL  N/A 03/31/2019   normal esophagus, erosive gastropathy s/p biopsied, duodenal erosions. Pathology with mild reactive gastropathy, negative for H. Pylori   TONSILLECTOMY     Family History: family history is not on file. Social History:  reports that she quit smoking about 5 years ago. Her smoking use included e-cigarettes and cigarettes. She started smoking about 9 years ago. She has a 1 pack-year smoking history. She has never used smokeless tobacco. She reports that she does not currently use alcohol. She reports that she does not currently use drugs.     Maternal Diabetes: No Genetic Screening: Normal Maternal Ultrasounds/Referrals: Normal Fetal Ultrasounds or other Referrals:  None Maternal Substance Abuse:  No Significant Maternal Medications:  None Significant Maternal Lab Results:  Group B Strep negative Number of Prenatal Visits:greater than 3 verified prenatal visits Maternal Vaccinations:TDap and Flu Other Comments:  None     12/04/2023   10:01 AM 12/04/2023    9:57 AM 12/04/2023    9:54 AM  Vitals with BMI  Systolic 105 101 425  Diastolic 59 58 57   Pulse 72 71 69   Constitutional:      Appearance: Normal appearance.  HENT:     Head: Normocephalic.  Eyes:     Pupils: Pupils are equal, round. Cardiovascular:     Rate and Rhythm: Normal rate.     Pulses: Normal pulses.  Abdominal:     General: Abdomen is Gravid, nontender Neurological:     Mental Status: She is alert.   Dilation: 2 Effacement (%): 70 Station: -2 Exam by:: Arlester Bence, RN Blood pressure 119/62, pulse 80, temperature 97.7 F (36.5 C), temperature source Oral, resp. rate 18, height 5' 9 (1.753 m), weight 78.9 kg, SpO2 99%.  Prenatal labs: ABO, Rh: --/--/AB NEG (06/20 9563) Antibody: NEG (06/20 0718) Rubella: Immune (11/08 0000) RPR: Nonreactive (11/08 0000)  HBsAg: Negative (11/08 0000)  HIV: Non-reactive (11/08 0000)  GBS: Negative/-- (05/23 0000)   Assessment/Plan: 28 yo G1P0 at [redacted]w[redacted]d presenting for augmentation of labor for cat 2 tracing. Epidural > then AROM. GBS neg  Grace Laura 12/04/2023, 9:40 AM

## 2023-12-04 NOTE — Progress Notes (Signed)
 Labor Progress Note  Progressing well after epidural FHT cat 1 Cervix 6/90/-1, AROMed clear fluid. Patient amenable to pitocin augmentation if needed.  Cathryn Cobb, MD

## 2023-12-04 NOTE — MAU Note (Signed)
 MAU Labor Triage Note:  .Madison Page is a 28 y.o. at [redacted]w[redacted]d here in MAU reporting:  Contractions every: 6 minutes Onset of ctx: 0230 Pain Score: 7  Pain Location: Abdomen  ROM: intact Vaginal Bleeding: none Last SVE: 2cm Labor Pain Management Plan: Planning epidural  GBS: Negative  Fetal Movement: Reports positive FM FHT:    Vitals:   12/04/23 0516  BP: 116/71  Pulse: 81  Resp: 18  Temp: 97.7 F (36.5 C)  SpO2: 100%       Lab orders placed from triage: MAU Labor Eval, appropriate provider to be notified based on nursing assessment. OB Office: [facility]

## 2023-12-05 ENCOUNTER — Inpatient Hospital Stay (HOSPITAL_COMMUNITY): Admitting: Anesthesiology

## 2023-12-05 LAB — CBC
HCT: 33 % — ABNORMAL LOW (ref 36.0–46.0)
Hemoglobin: 10.7 g/dL — ABNORMAL LOW (ref 12.0–15.0)
MCH: 30.1 pg (ref 26.0–34.0)
MCHC: 32.4 g/dL (ref 30.0–36.0)
MCV: 92.7 fL (ref 80.0–100.0)
Platelets: 185 10*3/uL (ref 150–400)
RBC: 3.56 MIL/uL — ABNORMAL LOW (ref 3.87–5.11)
RDW: 12.9 % (ref 11.5–15.5)
WBC: 15.2 10*3/uL — ABNORMAL HIGH (ref 4.0–10.5)
nRBC: 0 % (ref 0.0–0.2)

## 2023-12-05 MED ORDER — RHO D IMMUNE GLOBULIN 1500 UNIT/2ML IJ SOSY
300.0000 ug | PREFILLED_SYRINGE | Freq: Once | INTRAMUSCULAR | Status: AC
  Administered 2023-12-05: 300 ug via INTRAVENOUS
  Filled 2023-12-05: qty 2

## 2023-12-05 NOTE — Anesthesia Postprocedure Evaluation (Signed)
 Anesthesia Post Note  Patient: Madison Page, Madison Page  Procedure(s) Performed: AN AD HOC LABOR EPIDURAL     Patient location during evaluation: Mother Baby Anesthesia Type: Epidural Level of consciousness: awake and alert Pain management: pain level controlled Vital Signs Assessment: post-procedure vital signs reviewed and stable Respiratory status: spontaneous breathing, nonlabored ventilation and respiratory function stable Cardiovascular status: stable Postop Assessment: no headache, no backache and epidural receding Anesthetic complications: no   No notable events documented.  Last Vitals:  Vitals:   12/05/23 0024 12/05/23 0500  BP: 112/71 105/71  Pulse: 86 61  Resp: 18 16  Temp: 36.7 C 36.6 C  SpO2: 99% 100%    Last Pain:  Vitals:   12/05/23 0516  TempSrc:   PainSc: 5    Pain Goal: Patients Stated Pain Goal: 0 (12/04/23 0645)                 Madison Page

## 2023-12-05 NOTE — Anesthesia Postprocedure Evaluation (Signed)
 Anesthesia Post Note  Patient: Engineer, agricultural  Procedure(s) Performed: INDUCTION      Patient location during evaluation: Mother Baby Level of consciousness: awake, awake and alert and oriented Pain management: satisfactory to patient Vital Signs Assessment: post-procedure vital signs reviewed and stable Respiratory status: spontaneous breathing, nonlabored ventilation and respiratory function stable Cardiovascular status: blood pressure returned to baseline and stable Postop Assessment: no headache, no backache, no apparent nausea or vomiting, able to ambulate, adequate PO intake and patient able to bend at knees Anesthetic complications: no   No notable events documented.  Last Vitals:  Vitals:   12/05/23 0024 12/05/23 0500  BP: 112/71 105/71  Pulse: 86 61  Resp: 18 16  Temp: 36.7 C 36.6 C  SpO2: 99% 100%    Last Pain:  Vitals:   12/05/23 0720  TempSrc:   PainSc: 5    Pain Goal: Patients Stated Pain Goal: 0 (12/04/23 0645)              Epidural/Spinal Function Cutaneous sensation: Normal sensation (12/05/23 0720)  Clebert Wenger

## 2023-12-05 NOTE — Lactation Note (Signed)
 This note was copied from a baby's chart. Lactation Consultation Note  Patient Name: Madison Page Date: 12/05/2023 Age:27 hours Reason for consult: Follow-up assessment;MD order;Primapara;1st time breastfeeding;Term;Nipple pain/trauma;Breastfeeding assistance  P1- The pediatric NP requested for LC to see this MOB because infant has been causing pinching and nipple compression when latching. LC assisted with placing infant on the right breast in the cross cradle hold. LC reviewed how to place infant belly to belly and start nose to nipple. LC also reviewed how to stroke MOB's nipple from infant's nose to chin to elicit a gaping mouth. Infant latched immediatly with a strong rhythmic suck. LC noted that infant had a deep latch with most of MOB's areola in his mouth. LC also noted that infant's lips were flanged. Infant nursed for 15 minutes and during the entire duration, MOB complained of pinching. When infant unlatched after 15 minutes, LC noted that MOB's nipple was completely compressed and blanched at the tip. LC can not tell why infant is causing this issue because there is no obvious reasoning to it. LC updated the RN and the NP. LC encouraged supplementing to help keep infant's weight up. LC also encouraged MOB to pump. MOB's RN plans to set up the DEBP. LC provided MOB with hydrogel pads and coconut oil. LC encouraged MOB to use one or the other. LC encouraged MOB to follow up with the outpatient team.  Maternal Data Has patient been taught Hand Expression?: Yes Does the patient have breastfeeding experience prior to this delivery?: No  Feeding Mother's Current Feeding Choice: Breast Milk  LATCH Score Latch: Grasps breast easily, tongue down, lips flanged, rhythmical sucking.  Audible Swallowing: Spontaneous and intermittent  Type of Nipple: Everted at rest and after stimulation  Comfort (Breast/Nipple): Engorged, cracked, bleeding, large blisters, severe  discomfort  Hold (Positioning): Assistance needed to correctly position infant at breast and maintain latch.  LATCH Score: 7   Lactation Tools Discussed/Used Tools: Coconut oil;Comfort gels Pump Education: Milk Storage  Interventions Interventions: Breast feeding basics reviewed;Assisted with latch;Breast compression;Adjust position;Support pillows;Position options;Coconut oil;Comfort gels;Education;LC Services brochure  Discharge Discharge Education: Engorgement and breast care;Warning signs for feeding baby;Outpatient recommendation  Consult Status Consult Status: Follow-up Date: 12/06/23 Follow-up type: In-patient    Recardo Hoit BS, IBCLC 12/05/2023, 7:46 PM

## 2023-12-05 NOTE — Anesthesia Postprocedure Evaluation (Signed)
 Anesthesia Post Note  Patient: Madison Page, Madison Page  Procedure(s) Performed: INDUCTION      Patient location during evaluation: Mother Baby Level of consciousness: awake, awake and alert and oriented Pain management: satisfactory to patient Vital Signs Assessment: post-procedure vital signs reviewed and stable Respiratory status: spontaneous breathing, nonlabored ventilation and respiratory function stable Cardiovascular status: blood pressure returned to baseline and stable Postop Assessment: no headache, no backache, adequate PO intake, able to ambulate, no apparent nausea or vomiting and patient able to bend at knees Anesthetic complications: no   No notable events documented.  Last Vitals:  Vitals:   12/05/23 0024 12/05/23 0500  BP: 112/71 105/71  Pulse: 86 61  Resp: 18 16  Temp: 36.7 C 36.6 C  SpO2: 99% 100%    Last Pain:  Vitals:   12/05/23 0720  TempSrc:   PainSc: 5    Pain Goal: Patients Stated Pain Goal: 0 (12/04/23 0645)              Epidural/Spinal Function Cutaneous sensation: Normal sensation (12/05/23 0720)  Vercie Pokorny

## 2023-12-05 NOTE — Lactation Note (Signed)
 This note was copied from a baby's chart. Lactation Consultation Note  Patient Name: Madison Page Unijb'd Date: 12/05/2023 Age:27 hours Reason for consult: Follow-up assessment;1st time breastfeeding;Term;RN request  P1, Requested assistance with latching by RN.   Mother has tender nipples, compressed after feeding, positional stripe beginning on the left side. Mother states at an earlier feeding it was hurting on the left and she should have unlatched him. She feels she has early damage. For soreness suggest mother apply ebm or coconut oil while wearing shells and alternate with personal silveretes.  Mother has baby latched in football hold on the R breast when [facility] entered room. Kristin RN has assisted. Lips were flanged and baby was deep on the breast. During feeding noted clicking sound.  Provided chin support and noted clicking stopped.  Discussed if clicking starts again: It could be strong let down and baby is gulping.   It could be that baby has slipped down to tip of nipple so unlatch and relatch deeper. Provide chin support.  When baby unlatched, LC performed oral assessment.  Noted slight mid anterior tightness of lingual frenulum, not concerning at this time since baby has good tongue protrusion.  Repositioned on left breast after right in laid back hold.  Suggest alternating positions if soreness continues.    Maternal Data Has patient been taught Hand Expression?: Yes Does the patient have breastfeeding experience prior to this delivery?: No  Feeding Mother's Current Feeding Choice: Breast Milk  LATCH Score Latch: Grasps breast easily, tongue down, lips flanged, rhythmical sucking.  Audible Swallowing: A few with stimulation  Type of Nipple: Everted at rest and after stimulation  Comfort (Breast/Nipple): Filling, red/small blisters or bruises, mild/mod discomfort  Hold (Positioning): Assistance needed to correctly position infant at breast and maintain  latch.  LATCH Score: 7   Lactation Tools Discussed/Used Tools: Pump;Flanges;Coconut oil;Shells Flange Size: 18 Pumping frequency: PRN  Interventions Interventions: Breast feeding basics reviewed;Assisted with latch;Skin to skin;Hand express;Coconut oil;Shells;Pace feeding;Hand pump  Discharge Pump: Personal;Manual (Spectra )  Consult Status Consult Status: Follow-up Date: 12/06/23 Follow-up type: In-patient    Shannon Dines [facility] 12/05/2023, 8:02 AM

## 2023-12-05 NOTE — Progress Notes (Signed)
 Postpartum Progress Note  S: Feeling well. Having some cramping. Lochia appropriate. No subjective fevers/chills.   O:     12/05/2023    5:00 AM 12/05/2023   12:24 AM 12/04/2023    8:51 PM  Vitals with BMI  Systolic 105 112 878  Diastolic 71 71 68  Pulse 61 86 82    Gen: NAD, A&O Pulm: NWOB Abd: soft, appropriately ttp, fundus firm and below Umb Ext: No evidence of DVT, trace edema b/l  Labs Recent Results (from the past 2160 hours)  OB RESULT CONSOLE Group B Strep     Status: None   Collection Time: 11/06/23 12:00 AM  Result Value Ref Range   GBS Negative   Type and screen [facility]     Status: None   Collection Time: 12/04/23  7:18 AM  Result Value Ref Range   ABO/RH(D) AB NEG    Antibody Screen NEG    Sample Expiration      12/07/2023,2359 Performed at [facility], [address] [place], [place] [address]   RPR     Status: None   Collection Time: 12/04/23  7:20 AM  Result Value Ref Range   RPR Ser Ql NON REACTIVE NON REACTIVE    Comment: Performed at [facility], [address], [place], [place] [address]  CBC     Status: Abnormal   Collection Time: 12/04/23  8:16 AM  Result Value Ref Range   WBC 17.7 (H) 4.0 - 10.5 K/uL   RBC 4.24 3.87 - 5.11 MIL/uL   Hemoglobin 13.1 12.0 - 15.0 g/dL   HCT 60.3 63.9 - 53.9 %   MCV 93.4 80.0 - 100.0 fL   MCH 30.9 26.0 - 34.0 pg   MCHC 33.1 30.0 - 36.0 g/dL   RDW 87.0 88.4 - 84.4 %   Platelets 215 150 - 400 K/uL   nRBC 0.0 0.0 - 0.2 %    Comment: Performed at [facility], [address], [place], [place] [address]  RPR     Status: None   Collection Time: 12/04/23  8:16 AM  Result Value Ref Range   RPR Ser Ql NON REACTIVE NON REACTIVE    Comment: Performed at [facility], [address] [place], [place] [address]  CBC     Status: Abnormal   Collection Time: 12/05/23  4:46 AM  Result Value Ref Range   WBC 15.2 (H) 4.0 - 10.5 K/uL   RBC 3.56 (L) 3.87 - 5.11 MIL/uL    Hemoglobin 10.7 (L) 12.0 - 15.0 g/dL   HCT 66.9 (L) 63.9 - 53.9 %   MCV 92.7 80.0 - 100.0 fL   MCH 30.1 26.0 - 34.0 pg   MCHC 32.4 30.0 - 36.0 g/dL   RDW 87.0 88.4 - 84.4 %   Platelets 185 150 - 400 K/uL   nRBC 0.0 0.0 - 0.2 %    Comment: Performed at [facility], [address] [place], [place] [address]     A/P:  PPD1 s/p SVD, doing well pp. AFVSS. Benign exam.  Working on breastfeeding today. Continue present care. Plan for d/c PPD#2.   Slater Door, MD

## 2023-12-06 LAB — RH IG WORKUP (INCLUDES ABO/RH)
Fetal Screen: NEGATIVE
Gestational Age(Wks): 41
Unit division: 0

## 2023-12-06 MED ORDER — ACETAMINOPHEN 325 MG PO TABS: 650.0000 mg | ORAL_TABLET | ORAL | 1 refills | Status: AC | PRN

## 2023-12-06 MED ORDER — SENNOSIDES-DOCUSATE SODIUM 8.6-50 MG PO TABS: 2.0000 | ORAL_TABLET | Freq: Every day | ORAL | 1 refills | Status: AC

## 2023-12-06 MED ORDER — IBUPROFEN 600 MG PO TABS: 600.0000 mg | ORAL_TABLET | Freq: Four times a day (QID) | ORAL | 0 refills | Status: AC

## 2023-12-06 NOTE — Discharge Summary (Signed)
 Postpartum Discharge Summary  Date of Service updated 12/06/2023     Patient Name: Madison Page DOB: Sep 03, 1995 MRN: [id number]  Date of admission: 12/04/2023 Delivery date:12/04/2023 Delivering provider: LAURENCE SLATER PARAS Date of discharge: 12/06/2023  Admitting diagnosis: Normal labor and delivery [O80] Intrauterine pregnancy: [redacted]w[redacted]d     Secondary diagnosis:  Principal Problem:   Normal labor and delivery  Additional problems: Rh negative    Discharge diagnosis: Term Pregnancy Delivered                                              Post partum procedures:rhogam Augmentation: AROM Complications: None  Hospital course: Onset of Labor With Vaginal Delivery      28 y.o. yo G1P0 at [redacted]w[redacted]d was admitted in Latent Labor on 12/04/2023. Labor course was complicated by none.  Membrane Rupture Time/Date: 10:35 AM,12/04/2023  Delivery Method:Vaginal, Spontaneous Operative Delivery:N/A Episiotomy: None Lacerations:  2nd degree Patient had a postpartum course complicated by none.  She is ambulating, tolerating a regular diet, passing flatus, and urinating well. Patient is discharged home in stable condition on 12/06/23.  Newborn Data: Birth date:12/04/2023 Birth time:1:13 PM Gender:Female Living status:Living Apgars:9 ,9  Weight:3820 g  Magnesium Sulfate received: No BMZ received: No Rhophylac :Yes MMR:N/A T-DaP:Given prenatally Transfusion:No Immunizations administered: There is no immunization history for the selected administration types on file for this patient.  Physical exam  Vitals:   12/05/23 0500 12/05/23 1600 12/05/23 2053 12/06/23 0547  BP: 105/71 114/61 100/64 107/71  Pulse: 61 81 94 65  Resp: 16 18 16 18   Temp: 97.9 F (36.6 C) 98.5 F (36.9 C) 98 F (36.7 C) 97.9 F (36.6 C)  TempSrc: Oral Oral Oral Oral  SpO2: 100% 99% 100% 99%  Weight:      Height:       General: alert, cooperative, and no distress Lochia: appropriate Uterine Fundus: firm Incision:  N/A DVT Evaluation: No evidence of DVT seen on physical exam. Labs: Lab Results  Component Value Date   WBC 15.2 (H) 12/05/2023   HGB 10.7 (L) 12/05/2023   HCT 33.0 (L) 12/05/2023   MCV 92.7 12/05/2023   PLT 185 12/05/2023      Latest Ref Rng & Units 12/06/2020    2:42 PM  CMP  Glucose 65 - 139 mg/dL 894   BUN 7 - 25 mg/dL 17   Creatinine 9.49 - 1.10 mg/dL 9.26   Sodium 864 - 853 mmol/L 140   Potassium 3.5 - 5.3 mmol/L 3.8   Chloride 98 - 110 mmol/L 107   CO2 20 - 32 mmol/L 25   Calcium 8.6 - 10.2 mg/dL 9.1   Total Protein 6.1 - 8.1 g/dL 6.8   Total Bilirubin 0.2 - 1.2 mg/dL 0.8   AST 10 - 30 U/L 14   ALT 6 - 29 U/L 17    Edinburgh Score:    12/05/2023    5:16 AM  Edinburgh Postnatal Depression Scale Screening Tool  I have been able to laugh and see the funny side of things. 0  I have looked forward with enjoyment to things. 0  I have blamed myself unnecessarily when things went wrong. 0  I have been anxious or worried for no good reason. 1  I have felt scared or panicky for no good reason. 0  Things have been getting on top of me. 0  I have been so unhappy that I have had difficulty sleeping. 0  I have felt sad or miserable. 0  I have been so unhappy that I have been crying. 0  The thought of harming myself has occurred to me. 0  Edinburgh Postnatal Depression Scale Total 1      After visit meds:  Allergies as of 12/06/2023   No Known Allergies      Medication List     STOP taking these medications    Lo Loestrin Fe 1 MG-10 MCG / 10 MCG tablet Generic drug: Norethindrone-Ethinyl Estradiol-Fe Biphas       TAKE these medications    acetaminophen  325 MG tablet Commonly known as: Tylenol  Take 2 tablets (650 mg total) by mouth every 4 (four) hours as needed (for pain scale < 4).   FLAX SEEDS PO Take by mouth. As needed in food   ibuprofen  600 MG tablet Commonly known as: ADVIL  Take 1 tablet (600 mg total) by mouth every 6 (six) hours.    multivitamin-prenatal 27-0.8 MG Tabs tablet Take 1 tablet by mouth daily at 12 noon.   ondansetron  4 MG/5ML solution Commonly known as: ZOFRAN  Take 4 mg by mouth once.   pantoprazole  40 MG tablet Commonly known as: PROTONIX  TAKE 1 TABLET BY MOUTH DAILY 30 MINUTES BEFORE A MEAL   PROBIOTIC PO Take by mouth daily. As needed   promethazine  25 MG tablet Commonly known as: PHENERGAN  Take 0.5 tablets (12.5 mg total) by mouth every 8 (eight) hours as needed for nausea or vomiting. Monitor for drowsiness. May take whole tablet if needed.   senna-docusate 8.6-50 MG tablet Commonly known as: Senokot-S Take 2 tablets by mouth daily.         Discharge home in stable condition Infant Feeding: Bottle and Breast Infant Disposition:home with mother Discharge instruction: per After Visit Summary and Postpartum booklet. Activity: Advance as tolerated. Pelvic rest for 6 weeks.  Diet: routine diet Anticipated Birth Control: Unsure Postpartum Appointment:6 weeks Additional Postpartum F/U: none Future Appointments: Future Appointments  Date Time Provider Department Center  12/11/2023 12:00 AM [facility] SCHED ROOM [facility] None   Follow up Visit:  Follow-up Information     [place] Follow up.   Why: As scheduled Contact information: [address] [place] [address] [phone]                   12/06/2023 Slater JINNY Door, MD

## 2023-12-11 ENCOUNTER — Inpatient Hospital Stay (HOSPITAL_COMMUNITY): Admission: RE | Admit: 2023-12-11 | Source: Home / Self Care | Admitting: Obstetrics & Gynecology

## 2023-12-11 ENCOUNTER — Inpatient Hospital Stay (HOSPITAL_COMMUNITY)

## 2023-12-11 ENCOUNTER — Inpatient Hospital Stay (HOSPITAL_COMMUNITY): Admitting: Anesthesiology

## 2023-12-15 ENCOUNTER — Telehealth (HOSPITAL_COMMUNITY): Payer: Self-pay | Admitting: *Deleted

## 2023-12-15 NOTE — Telephone Encounter (Signed)
 12/15/2023  Name: Madison Page MRN: [id number] DOB: 12-25-1995  Reason for Call:  Transition of Care Hospital Discharge Call  Contact Status: Patient Contact Status: Complete  Language assistant needed: Interpreter Mode: Interpreter Not Needed        Follow-Up Questions: Do You Have Any Concerns About Your Health As You Heal From Delivery?: No Do You Have Any Concerns About Your Infants Health?: No  Edinburgh Postnatal Depression Scale:  In the Past 7 Days: I have been able to laugh and see the funny side of things.: As much as I always could I have looked forward with enjoyment to things.: As much as I ever did I have blamed myself unnecessarily when things went wrong.: No, never I have been anxious or worried for no good reason.: No, not at all I have felt scared or panicky for no good reason.: No, not at all Things have been getting on top of me.: No, most of the time I have coped quite well I have been so unhappy that I have had difficulty sleeping.: Not at all I have felt sad or miserable.: No, not at all I have been so unhappy that I have been crying.: No, never The thought of harming myself has occurred to me.: Never [place] Postnatal Depression Scale Total: 1  PHQ2-9 Depression Scale:     Discharge Follow-up: Edinburgh score requires follow up?: No Patient was advised of the following resources:: Support Group, Breastfeeding Support Group, Lactation (Patient stated she wanted to make a lactation outpatient appointment. Provided the lacation consultation phone number to patient.)  Post-discharge interventions: Reviewed Newborn Safe Sleep Practices  Steva Tammy PEAK 12/15/2023 [phone]

## 2023-12-26 DIAGNOSIS — Z419 Encounter for procedure for purposes other than remedying health state, unspecified: Secondary | ICD-10-CM | POA: Diagnosis not present

## 2024-01-15 DIAGNOSIS — Z1389 Encounter for screening for other disorder: Secondary | ICD-10-CM | POA: Diagnosis not present

## 2024-01-26 DIAGNOSIS — Z419 Encounter for procedure for purposes other than remedying health state, unspecified: Secondary | ICD-10-CM | POA: Diagnosis not present

## 2024-02-26 DIAGNOSIS — Z419 Encounter for procedure for purposes other than remedying health state, unspecified: Secondary | ICD-10-CM | POA: Diagnosis not present

## 2024-04-07 DIAGNOSIS — H00021 Hordeolum internum right upper eyelid: Secondary | ICD-10-CM | POA: Diagnosis not present

## 2024-04-27 DIAGNOSIS — Z419 Encounter for procedure for purposes other than remedying health state, unspecified: Secondary | ICD-10-CM | POA: Diagnosis not present
